# Patient Record
Sex: Female | Born: 1960 | Race: White | Hispanic: No | Marital: Married | Smoking: Never smoker
Health system: Southern US, Community
[De-identification: ages and names within clinical notes are randomized; demographics above are authoritative.]

## PROBLEM LIST (undated history)

## (undated) DIAGNOSIS — F329 Major depressive disorder, single episode, unspecified: Secondary | ICD-10-CM

## (undated) DIAGNOSIS — M1990 Unspecified osteoarthritis, unspecified site: Secondary | ICD-10-CM

## (undated) DIAGNOSIS — G709 Myoneural disorder, unspecified: Secondary | ICD-10-CM

## (undated) DIAGNOSIS — M810 Age-related osteoporosis without current pathological fracture: Secondary | ICD-10-CM

## (undated) DIAGNOSIS — F32A Depression, unspecified: Secondary | ICD-10-CM

## (undated) DIAGNOSIS — E162 Hypoglycemia, unspecified: Secondary | ICD-10-CM

## (undated) HISTORY — DX: Age-related osteoporosis without current pathological fracture: M81.0

---

## 1994-10-05 HISTORY — PX: GANGLION CYST EXCISION: SHX1691

## 2012-10-05 HISTORY — PX: ABDOMINAL HYSTERECTOMY: SHX81

## 2015-08-28 ENCOUNTER — Other Ambulatory Visit: Payer: Self-pay | Admitting: Surgery

## 2015-08-28 DIAGNOSIS — M5416 Radiculopathy, lumbar region: Secondary | ICD-10-CM

## 2015-09-16 ENCOUNTER — Emergency Department
Admission: EM | Admit: 2015-09-16 | Discharge: 2015-09-16 | Disposition: A | Discharge status: Home / Self Care | Payer: Self-pay | Attending: Emergency Medicine | Admitting: Emergency Medicine

## 2015-09-16 ENCOUNTER — Encounter: Payer: Self-pay | Admitting: Emergency Medicine

## 2015-09-16 DIAGNOSIS — M5442 Lumbago with sciatica, left side: Secondary | ICD-10-CM | POA: Insufficient documentation

## 2015-09-16 DIAGNOSIS — Z79899 Other long term (current) drug therapy: Secondary | ICD-10-CM | POA: Insufficient documentation

## 2015-09-16 HISTORY — DX: Depression, unspecified: F32.A

## 2015-09-16 HISTORY — DX: Major depressive disorder, single episode, unspecified: F32.9

## 2015-09-16 LAB — CBC WITH DIFFERENTIAL/PLATELET
BASOS ABS: 0 10*3/uL (ref 0–0.1)
BASOS PCT: 0 %
EOS ABS: 0 10*3/uL (ref 0–0.7)
Eosinophils Relative: 0 %
HEMATOCRIT: 40.5 % (ref 35.0–47.0)
Hemoglobin: 13.6 g/dL (ref 12.0–16.0)
Lymphocytes Relative: 11 %
Lymphs Abs: 1.3 10*3/uL (ref 1.0–3.6)
MCH: 29.2 pg (ref 26.0–34.0)
MCHC: 33.7 g/dL (ref 32.0–36.0)
MCV: 86.7 fL (ref 80.0–100.0)
MONO ABS: 0.7 10*3/uL (ref 0.2–0.9)
Monocytes Relative: 6 %
NEUTROS ABS: 9.3 10*3/uL — AB (ref 1.4–6.5)
Neutrophils Relative %: 83 %
PLATELETS: 203 10*3/uL (ref 150–440)
RBC: 4.67 MIL/uL (ref 3.80–5.20)
RDW: 12.8 % (ref 11.5–14.5)
WBC: 11.2 10*3/uL — ABNORMAL HIGH (ref 3.6–11.0)

## 2015-09-16 LAB — BASIC METABOLIC PANEL
ANION GAP: 7 (ref 5–15)
BUN: 13 mg/dL (ref 6–20)
CALCIUM: 9.1 mg/dL (ref 8.9–10.3)
CO2: 23 mmol/L (ref 22–32)
Chloride: 108 mmol/L (ref 101–111)
Creatinine, Ser: 0.64 mg/dL (ref 0.44–1.00)
Glucose, Bld: 94 mg/dL (ref 65–99)
Potassium: 3.7 mmol/L (ref 3.5–5.1)
SODIUM: 138 mmol/L (ref 135–145)

## 2015-09-16 MED ORDER — IBUPROFEN 800 MG PO TABS: 800.0000 mg | ORAL_TABLET | Freq: Three times a day (TID) | ORAL | Status: DC | PRN

## 2015-09-16 MED ORDER — DIAZEPAM 5 MG/ML IJ SOLN
1.0000 mg | Freq: Once | INTRAMUSCULAR | Status: AC
  Administered 2015-09-16: 1 mg via INTRAVENOUS
  Filled 2015-09-16: qty 2

## 2015-09-16 MED ORDER — OXYCODONE-ACETAMINOPHEN 5-325 MG PO TABS
1.0000 | ORAL_TABLET | Freq: Once | ORAL | Status: AC
  Administered 2015-09-16: 1 via ORAL
  Filled 2015-09-16: qty 1

## 2015-09-16 MED ORDER — KETOROLAC TROMETHAMINE 30 MG/ML IJ SOLN
30.0000 mg | Freq: Once | INTRAMUSCULAR | Status: AC
  Administered 2015-09-16: 30 mg via INTRAVENOUS
  Filled 2015-09-16: qty 1

## 2015-09-16 NOTE — ED Notes (Signed)
Patient up to wheelchair for DC, began to feel weak, returned to bed and juice given, resting.

## 2015-09-16 NOTE — ED Notes (Signed)
Feeling better, up to wheelchair, dc with husband by car

## 2015-09-16 NOTE — ED Notes (Signed)
54 yof presents from home via EMS for c/c exacerbation of low back pain. Recently dx in 07/2015 with compression of lumbar vertebrae [facility]. Scheduled for MRI this week. Today patient woke from sleep to use the bathroom, pain exacerbated with radiation down her leg. No incontinence of urine or stool.

## 2015-09-16 NOTE — Discharge Instructions (Signed)
Chronic Back Pain  When back pain lasts longer than 3 months, it is called chronic back pain.People with chronic back pain often go through certain periods that are more intense (flare-ups).  CAUSES Chronic back pain can be caused by wear and tear (degeneration) on different structures in your back. These structures include:  The bones of your spine (vertebrae) and the joints surrounding your spinal cord and nerve roots (facets).  The strong, fibrous tissues that connect your vertebrae (ligaments). Degeneration of these structures may result in pressure on your nerves. This can lead to constant pain. HOME CARE INSTRUCTIONS  Avoid bending, heavy lifting, prolonged sitting, and activities which make the problem worse.  Take brief periods of rest throughout the day to reduce your pain. Lying down or standing usually is better than sitting while you are resting.  Take over-the-counter or prescription medicines only as directed by your caregiver. SEEK IMMEDIATE MEDICAL CARE IF:   You have weakness or numbness in one of your legs or feet.  You have trouble controlling your bladder or bowels.  You have nausea, vomiting, abdominal pain, shortness of breath, or fainting.   This information is not intended to replace advice given to you by your health care provider. Make sure you discuss any questions you have with your health care provider.   Document Released: 10/29/2004 Document Revised: 12/14/2011 Document Reviewed: 03/11/2015 Elsevier Interactive Patient Education [employer]2016 [employer]vier Inc.  Please follow-up with your doctor and get the MRI as planned. Please return here if you need more pain control. For now I want to try some 800 mg Motrin 3 times a day with food. To any heavy lifting or strenuous activity.

## 2015-09-16 NOTE — ED Provider Notes (Signed)
[facility] Emergency Department Provider Note  ____________________________________________  Time seen: Approximately 5:21 AM  I have reviewed the triage vital signs and the nursing notes.   HISTORY  Chief Complaint Back Pain    HPI Annette Davis is a 54 y.o. female who presents to the ED from home via EMS with the chief complaint of low back pain.Patient is undergoing evaluation by [facility] orthopedics since October for "pinched nerves in back" which she believes was from moving boxes. Scheduled for MRI later this week. She is currently taking baclofen. This morning, patient awoke from sleep to use the restroom which exacerbated her pain. Complains of low back pain with spasms with sharp shooting pains radiating down her left leg. Denies associated symptoms of numbness/tingling, bowel or bladder incontinence. Denies recent trauma. Denies recent fever, chills, chest pain, shortness breath, abdominal pain, nausea, vomiting, diarrhea. Nothing makes her pain better. Movement makes her pain worse.   Past Medical History  Diagnosis Date  . Depression     There are no active problems to display for this patient.   Past Surgical History  Procedure Laterality Date  . Abdominal hysterectomy  2014    Current Outpatient Rx  Name  Route  Sig  Dispense  Refill  . baclofen (LIORESAL) 10 MG tablet   Oral   Take 10 mg by mouth 3 (three) times daily.         . citalopram (CELEXA) 20 MG tablet   Oral   Take 20 mg by mouth daily.           Allergies Sulfa antibiotics  History reviewed. No pertinent family history.  Social History Social History  Substance Use Topics  . Smoking status: Never Smoker   . Smokeless tobacco: None  . Alcohol Use: No    Review of Systems Constitutional: No fever/chills Eyes: No visual changes. ENT: No sore throat. Cardiovascular: Denies chest pain. Respiratory: Denies shortness of breath. Gastrointestinal: No  abdominal pain.  No nausea, no vomiting.  No diarrhea.  No constipation. Genitourinary: Negative for dysuria. Musculoskeletal: Positive for back pain. Skin: Negative for rash. Neurological: Negative for headaches, focal weakness or numbness.  10-point ROS otherwise negative.  ____________________________________________   PHYSICAL EXAM:  VITAL SIGNS: ED Triage Vitals  Enc Vitals Group     BP 09/16/15 0421 106/73 mmHg     Pulse Rate 09/16/15 0421 84     Resp 09/16/15 0421 18     Temp 09/16/15 0421 98.1 F (36.7 C)     Temp Source 09/16/15 0421 Oral     SpO2 09/16/15 0421 95 %     Weight 09/16/15 0421 135 lb (61.236 kg)     Height 09/16/15 0421  (1.651 m)     Head Cir --      Peak Flow --      Pain Score 09/16/15 0422 7     Pain Loc --      Pain Edu? --      Excl. in GC? --     Constitutional: Alert and oriented. Well appearing and in moderate acute distress. Eyes: Conjunctivae are normal. PERRL. EOMI. Head: Atraumatic. Nose: No congestion/rhinnorhea. Mouth/Throat: Mucous membranes are moist.  Oropharynx non-erythematous. Neck: No stridor.  No cervical spine tenderness to palpation. Cardiovascular: Normal rate, regular rhythm. Grossly normal heart sounds.  Good peripheral circulation. Respiratory: Normal respiratory effort.  No retractions. Lungs CTAB. Gastrointestinal: Soft and nontender. No distention. No abdominal bruits. No CVA tenderness. Musculoskeletal: No lumbar  spinal tenderness to palpation. Lumbar paraspinal spasms, left greater than right. Left buttock tender to palpation. Straight leg raise positive at 30 on the left. Neurologic:  Normal speech and language. No gross focal neurologic deficits are appreciated. 5/5 motor strength and sensation bilateral lower extremities. Dorsiflexion equal and strong bilaterally. Skin:  Skin is warm, dry and intact. No rash noted. Psychiatric: Mood and affect are normal. Speech and behavior are  normal.  ____________________________________________   LABS (all labs ordered are listed, but only abnormal results are displayed)  Labs Reviewed  CBC WITH DIFFERENTIAL/PLATELET  BASIC METABOLIC PANEL   ____________________________________________  EKG  None ____________________________________________  RADIOLOGY  None ____________________________________________   PROCEDURES  Procedure(s) performed: None  Critical Care performed: No  ____________________________________________   INITIAL IMPRESSION / ASSESSMENT AND PLAN / ED COURSE  Pertinent labs & imaging results that were available during my care of the patient were reviewed by me and considered in my medical decision making (see chart for details).  54 year old female with ongoing low back pain with exacerbation this morning. Symptoms consistent with left acute sciatica. Will administer IV analgesia, muscle relaxer and reassess.  ----------------------------------------- 7:13 AM on 09/16/2015 -----------------------------------------  Patient quite sedated after IV medicines. Laboratory results pending. Discussed with spouse; will continue to observe patient in the emergency department. Will reassess. Anticipate discharge home is patient is able to ambulate. Care transferred to  Dr. Darnelle CatalanMalinda. ____________________________________________   FINAL CLINICAL IMPRESSION(S) / ED DIAGNOSES  Final diagnoses:  Low back pain with left-sided sciatica, unspecified back pain laterality      Irean HongJade J Demorio Seeley, MD 09/16/15 [phone]

## 2015-09-18 ENCOUNTER — Ambulatory Visit
Admission: RE | Admit: 2015-09-18 | Discharge: 2015-09-18 | Disposition: A | Discharge status: Home / Self Care | Payer: Self-pay | Source: Ambulatory Visit | Attending: Surgery | Admitting: Surgery

## 2015-09-18 DIAGNOSIS — M47816 Spondylosis without myelopathy or radiculopathy, lumbar region: Secondary | ICD-10-CM | POA: Insufficient documentation

## 2015-09-18 DIAGNOSIS — M899 Disorder of bone, unspecified: Secondary | ICD-10-CM | POA: Insufficient documentation

## 2015-09-18 DIAGNOSIS — M5416 Radiculopathy, lumbar region: Secondary | ICD-10-CM | POA: Insufficient documentation

## 2015-09-20 ENCOUNTER — Other Ambulatory Visit: Payer: Self-pay | Admitting: Student

## 2015-09-20 DIAGNOSIS — M899 Disorder of bone, unspecified: Secondary | ICD-10-CM

## 2015-09-27 ENCOUNTER — Ambulatory Visit: Payer: Self-pay

## 2015-10-02 ENCOUNTER — Ambulatory Visit
Admission: RE | Admit: 2015-10-02 | Discharge: 2015-10-02 | Disposition: A | Discharge status: Home / Self Care | Payer: Self-pay | Source: Ambulatory Visit | Attending: Student | Admitting: Student

## 2015-10-02 DIAGNOSIS — M899 Disorder of bone, unspecified: Secondary | ICD-10-CM | POA: Insufficient documentation

## 2015-11-13 ENCOUNTER — Other Ambulatory Visit: Payer: Self-pay | Admitting: Internal Medicine

## 2015-11-13 DIAGNOSIS — Z1239 Encounter for other screening for malignant neoplasm of breast: Secondary | ICD-10-CM

## 2015-11-15 ENCOUNTER — Other Ambulatory Visit: Payer: Self-pay | Admitting: Orthopedic Surgery

## 2015-11-21 ENCOUNTER — Other Ambulatory Visit: Payer: Self-pay | Admitting: *Deleted

## 2015-11-21 ENCOUNTER — Other Ambulatory Visit: Payer: Self-pay | Admitting: Internal Medicine

## 2015-11-21 ENCOUNTER — Inpatient Hospital Stay
Admission: RE | Admit: 2015-11-21 | Discharge: 2015-11-21 | Disposition: A | Discharge status: Home / Self Care | Payer: Self-pay | Source: Ambulatory Visit | Attending: *Deleted | Admitting: *Deleted

## 2015-11-21 DIAGNOSIS — R921 Mammographic calcification found on diagnostic imaging of breast: Secondary | ICD-10-CM

## 2015-11-21 DIAGNOSIS — Z9289 Personal history of other medical treatment: Secondary | ICD-10-CM

## 2015-11-23 NOTE — H&P (Addendum)
     PREOPERATIVE H&P  Chief Complaint: R leg pain  HPI: Lasundra Hascall is a 55 y.o. 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 Depression    Past Surgical History  Procedure Laterality Date  . Abdominal hysterectomy  2014   Social History   Social History  . Marital Status: Married    Spouse Name: N/A  . Number of Children: N/A  . Years of Education: N/A   Social History Main Topics  . Smoking status: Never Smoker   . Smokeless tobacco: Not on file  . Alcohol Use: No  . Drug Use: No  . Sexual Activity: No   Other Topics Concern  . Not on file   Social History Narrative  . No narrative on file   No family history on file. Allergies  Allergen Reactions  . Other Swelling    Cannot swallow raw carrots  . Sulfa Antibiotics Nausea And Vomiting   Prior to Admission medications   Medication Sig Start Date End Date Taking? Authorizing Provider  baclofen (LIORESAL) 10 MG tablet Take 10 mg by mouth daily as needed for muscle spasms. Reported on 11/20/2015   Yes Historical Provider, MD  Calcium Carbonate-Vitamin D (CALCIUM-VITAMIN D) 500-200 MG-UNIT tablet Take 1 tablet by mouth 2 (two) times daily.   Yes Historical Provider, MD  citalopram (CELEXA) 20 MG tablet Take 20 mg by mouth daily.   Yes Historical Provider, MD  HYDROcodone-acetaminophen (NORCO/VICODIN) 5-325 MG tablet Take 1 tablet by mouth 2 (two) times daily as needed for severe pain.  09/26/15  Yes Historical Provider, MD  ibuprofen (ADVIL,MOTRIN) 800 MG tablet Take 1 tablet (800 mg total) by mouth every 8 (eight) hours as needed. Patient taking differently: Take 800 mg by mouth 2 (two) times daily  as needed for moderate pain.  09/16/15  Yes Arnaldo Natal, MD  Multiple Vitamin (MULTI-VITAMINS) TABS Take 1 tablet by mouth daily.   Yes Historical Provider, MD     All other systems have been reviewed and were otherwise negative with the exception of those mentioned in the HPI and as above.  Physical Exam: There were no vitals filed for this visit.  General: Alert, no acute distress Cardiovascular: No pedal edema Respiratory: No cyanosis, no use of accessory musculature Skin: No lesions in the area of chief complaint Neurologic: Sensation intact distally Psychiatric: Patient is competent for consent with normal mood and affect Lymphatic: No axillary or cervical lymphadenopathy  MUSCULOSKELETAL: 4/5 knee extension on the right  Assessment/Plan: Radiculopathy Plan for Procedure(s): RIGHT SIDED LUMBAR 4-5 TRANSFORAMINAL LUMBAR INTERBODY FUSION WITH INSTRUMENTATION AND ALLOGRAFT   Emilee Hero, MD 11/23/2015 8:37 AM   Of note, preoperatively this AM, patient actually reports that she hasn't had any pain in the R leg leg, but has been having significant constant pain in the L leg. Will therefore use a left-sided approach for TLIF. This has been discussed with patient, and she is in agreement.

## 2015-11-25 ENCOUNTER — Other Ambulatory Visit (HOSPITAL_COMMUNITY): Payer: Self-pay | Admitting: *Deleted

## 2015-11-25 ENCOUNTER — Encounter (HOSPITAL_COMMUNITY)
Admission: RE | Admit: 2015-11-25 | Discharge: 2015-11-25 | Disposition: A | Discharge status: Home / Self Care | Payer: Self-pay | Source: Ambulatory Visit | Attending: Orthopedic Surgery | Admitting: Orthopedic Surgery

## 2015-11-25 ENCOUNTER — Encounter (HOSPITAL_COMMUNITY): Payer: Self-pay

## 2015-11-25 DIAGNOSIS — M5410 Radiculopathy, site unspecified: Secondary | ICD-10-CM | POA: Insufficient documentation

## 2015-11-25 DIAGNOSIS — Z01812 Encounter for preprocedural laboratory examination: Secondary | ICD-10-CM | POA: Insufficient documentation

## 2015-11-25 DIAGNOSIS — E162 Hypoglycemia, unspecified: Secondary | ICD-10-CM | POA: Insufficient documentation

## 2015-11-25 DIAGNOSIS — Z01818 Encounter for other preprocedural examination: Secondary | ICD-10-CM | POA: Insufficient documentation

## 2015-11-25 DIAGNOSIS — Z0183 Encounter for blood typing: Secondary | ICD-10-CM | POA: Insufficient documentation

## 2015-11-25 DIAGNOSIS — F329 Major depressive disorder, single episode, unspecified: Secondary | ICD-10-CM | POA: Insufficient documentation

## 2015-11-25 DIAGNOSIS — Z79899 Other long term (current) drug therapy: Secondary | ICD-10-CM | POA: Insufficient documentation

## 2015-11-25 HISTORY — DX: Hypoglycemia, unspecified: E16.2

## 2015-11-25 HISTORY — DX: Myoneural disorder, unspecified: G70.9

## 2015-11-25 HISTORY — DX: Unspecified osteoarthritis, unspecified site: M19.90

## 2015-11-25 LAB — URINALYSIS, ROUTINE W REFLEX MICROSCOPIC
BILIRUBIN URINE: NEGATIVE
Glucose, UA: NEGATIVE mg/dL
Ketones, ur: NEGATIVE mg/dL
NITRITE: NEGATIVE
Protein, ur: NEGATIVE mg/dL
Specific Gravity, Urine: 1.022 (ref 1.005–1.030)
pH: 5.5 (ref 5.0–8.0)

## 2015-11-25 LAB — URINE MICROSCOPIC-ADD ON

## 2015-11-25 LAB — SURGICAL PCR SCREEN
MRSA, PCR: NEGATIVE
STAPHYLOCOCCUS AUREUS: POSITIVE — AB

## 2015-11-25 LAB — TYPE AND SCREEN
ABO/RH(D): B POS
ANTIBODY SCREEN: NEGATIVE

## 2015-11-25 LAB — APTT: APTT: 25 s (ref 24–37)

## 2015-11-25 LAB — ABO/RH: ABO/RH(D): B POS

## 2015-11-25 LAB — PROTIME-INR
INR: 1.04 (ref 0.00–1.49)
PROTHROMBIN TIME: 13.8 s (ref 11.6–15.2)

## 2015-11-25 NOTE — Progress Notes (Signed)
Pt. Brought lab values with her from PCP. Will ask for anesth. Review to see if they suffice for surgery even though they are 2 weeks +1 day from surgery

## 2015-11-25 NOTE — Progress Notes (Signed)
PCP- Gloriann Loan, no cardiac testing ever done.

## 2015-11-25 NOTE — Pre-Procedure Instructions (Signed)
Annette Davis  11/25/2015      [id number] [place], [facility] - [address] [place] [address] Phone: [phone] Fax: [phone]    Your procedure is scheduled on 11/28/2015.  Report to [facility] Admitting at 5:30 A.M.  Call this number if you have problems the morning of surgery:  [phone]   Remember:  Do not eat food or drink liquids after midnight. On Wednesday   Take these medicines the morning of surgery with A SIP OF WATER : pain medicine is ok if needed the morning of surgery    Do not wear jewelry, make-up or nail polish.   Do not wear lotions, powders, or perfumes.  You may wear deodorant.   Do not shave 48 hours prior to surgery.     Do not bring valuables to the hospital.   [facility] is not responsible for any belongings or valuables.  Contacts, dentures or bridgework may not be worn into surgery.  Leave your suitcase in the car.  After surgery it may be brought to your room.  For patients admitted to the hospital, discharge time will be determined by your treatment team.  Patients discharged the day of surgery will not be allowed to drive home.   Name and phone number of your driver:   With spouse Special instructions:  Special Instructions: [place] - Preparing for Surgery  Before surgery, you can play an important role.  Because skin is not sterile, your skin needs to be as free of germs as possible.  You can reduce the number of germs on you skin by washing with CHG (chlorahexidine gluconate) soap before surgery.  CHG is an antiseptic cleaner which kills germs and bonds with the skin to continue killing germs even after washing.  Please DO NOT use if you have an allergy to CHG or antibacterial soaps.  If your skin becomes reddened/irritated stop using the CHG and inform your nurse when you arrive at Short Stay.  Do not shave (including legs and underarms) for at least 48 hours prior to the first CHG  shower.  You may shave your face.  Please follow these instructions carefully:   1.  Shower with CHG Soap the night before surgery and the  morning of Surgery.  2.  If you choose to wash your hair, wash your hair first as usual with your  normal shampoo.  3.  After you shampoo, rinse your hair and body thoroughly to remove the  Shampoo.  4.  Use CHG as you would any other liquid soap.  You can apply chg directly to the skin and wash gently with scrungie or a clean washcloth.  5.  Apply the CHG Soap to your body ONLY FROM THE NECK DOWN.    Do not use on open wounds or open sores.  Avoid contact with your eyes, ears, mouth and genitals (private parts).  Wash genitals (private parts)   with your normal soap.  6.  Wash thoroughly, paying special attention to the area where your surgery will be performed.  7.  Thoroughly rinse your body with warm water from the neck down.  8.  DO NOT shower/wash with your normal soap after using and rinsing off   the CHG Soap.  9.  Pat yourself dry with a clean towel.            10.  Wear clean pajamas.  11.  Place clean sheets on your bed the night of your first shower and do not sleep with pets.  Day of Surgery  Do not apply any lotions/deodorants the morning of surgery.  Please wear clean clothes to the hospital/surgery center.  Please read over the following fact sheets that you were given. Pain Booklet, Coughing and Deep Breathing, Blood Transfusion Information, MRSA Information and Surgical Site Infection Prevention

## 2015-11-26 NOTE — Progress Notes (Signed)
Anesthesia Chart Review: Patient is a 55 year old female scheduled for right sided L4-5 transforaminal lumbar fusion on 11/28/15 by Dr. Yevette Edwards.  History includes non-smoker, arthritis, hypoglycemia, depression, hysterectomy '14. By PCP notes, patient also had spinal surgery 07/2014 [facility] - [place], [place]). She recently relocated to [place].  BMI 23. PCP is Dr. Leotis Shames with [facility], seen 11/13/15 for annual exam and to establish care (see Care Everywhere). She is aware of surgery plans.  PAT Vitals: BP 106/66, HR 82, RR 20, T 36.4C, O2 sat 96%.  Meds include baclofen, Celexa, ibuprofen, MVI, Norco.   11/25/15 EKG: NSR. Non-specific T wave abnormality. Isolated small Q wave in a low voltage QRS complex in aVL.  11/25/15 CXR: IMPRESSION: There is no active cardiopulmonary disease.  Labs on 11/13/15 (see Care Everywhere, copy also on chart) showed a normal CBC, normal Chem profile except glucose was 63. Total cholesterol 201 (HDL 65, LDL 115). Normal TSH 1.735. PT/PTT and T&S were done 11/25/15.  Her 11/13/15 labs were unremarkable and will only be 32 days old on her surgery date. She in not a diuretic or anti-hypertensive medication, so I am not planning to repeat any labs prior to her surgery. Fortunately, with her history of hypoglycemia, she is a first case. Will order a CBG for baseline prior to surgery.   If no acute changes then I anticipate that she can proceed as planned.  Velna Ochs [facility] Short Stay Center/Anesthesiology Phone [phone] 11/26/2015 12:25 PM

## 2015-11-27 MED ORDER — CEFAZOLIN SODIUM-DEXTROSE 2-3 GM-% IV SOLR
2.0000 g | INTRAVENOUS | Status: AC
  Administered 2015-11-28: 2 g via INTRAVENOUS
  Filled 2015-11-27: qty 50

## 2015-11-27 MED ORDER — POVIDONE-IODINE 7.5 % EX SOLN
Freq: Once | CUTANEOUS | Status: DC
  Filled 2015-11-27: qty 118

## 2015-11-28 ENCOUNTER — Inpatient Hospital Stay (HOSPITAL_COMMUNITY): Payer: Self-pay | Admitting: Vascular Surgery

## 2015-11-28 ENCOUNTER — Inpatient Hospital Stay (HOSPITAL_COMMUNITY): Payer: Self-pay

## 2015-11-28 ENCOUNTER — Encounter (HOSPITAL_COMMUNITY)
Admission: RE | Disposition: A | Discharge status: Home / Self Care | Payer: Self-pay | Source: Ambulatory Visit | Attending: Orthopedic Surgery

## 2015-11-28 ENCOUNTER — Inpatient Hospital Stay (HOSPITAL_COMMUNITY): Payer: Self-pay | Admitting: Anesthesiology

## 2015-11-28 ENCOUNTER — Encounter (HOSPITAL_COMMUNITY): Payer: Self-pay | Admitting: Surgery

## 2015-11-28 ENCOUNTER — Inpatient Hospital Stay (HOSPITAL_COMMUNITY)
Admission: RE | Admit: 2015-11-28 | Discharge: 2015-11-30 | DRG: 460 | Disposition: A | Discharge status: Home / Self Care | Payer: Self-pay | Source: Ambulatory Visit | Attending: Orthopedic Surgery | Admitting: Orthopedic Surgery

## 2015-11-28 DIAGNOSIS — M5116 Intervertebral disc disorders with radiculopathy, lumbar region: Principal | ICD-10-CM | POA: Diagnosis present

## 2015-11-28 DIAGNOSIS — Z79899 Other long term (current) drug therapy: Secondary | ICD-10-CM

## 2015-11-28 DIAGNOSIS — M5410 Radiculopathy, site unspecified: Secondary | ICD-10-CM | POA: Diagnosis present

## 2015-11-28 DIAGNOSIS — M4316 Spondylolisthesis, lumbar region: Secondary | ICD-10-CM | POA: Diagnosis present

## 2015-11-28 DIAGNOSIS — Z419 Encounter for procedure for purposes other than remedying health state, unspecified: Secondary | ICD-10-CM

## 2015-11-28 DIAGNOSIS — F329 Major depressive disorder, single episode, unspecified: Secondary | ICD-10-CM | POA: Diagnosis present

## 2015-11-28 DIAGNOSIS — M4806 Spinal stenosis, lumbar region: Secondary | ICD-10-CM | POA: Diagnosis present

## 2015-11-28 LAB — GLUCOSE, CAPILLARY: GLUCOSE-CAPILLARY: 84 mg/dL (ref 65–99)

## 2015-11-28 SURGERY — POSTERIOR LUMBAR FUSION 1 LEVEL
Anesthesia: General | Laterality: Right

## 2015-11-28 MED ORDER — BUPIVACAINE-EPINEPHRINE (PF) 0.25% -1:200000 IJ SOLN
INTRAMUSCULAR | Status: AC
  Filled 2015-11-28: qty 30

## 2015-11-28 MED ORDER — DIPHENHYDRAMINE HCL 50 MG/ML IJ SOLN
INTRAMUSCULAR | Status: DC | PRN
  Administered 2015-11-28: 12.5 mg via INTRAVENOUS

## 2015-11-28 MED ORDER — ROCURONIUM BROMIDE 100 MG/10ML IV SOLN
INTRAVENOUS | Status: DC | PRN
  Administered 2015-11-28: 20 mg via INTRAVENOUS
  Administered 2015-11-28: 10 mg via INTRAVENOUS
  Administered 2015-11-28: 40 mg via INTRAVENOUS

## 2015-11-28 MED ORDER — MENTHOL 3 MG MT LOZG: 1.0000 | LOZENGE | OROMUCOSAL | Status: DC | PRN

## 2015-11-28 MED ORDER — OXYCODONE-ACETAMINOPHEN 5-325 MG PO TABS
1.0000 | ORAL_TABLET | ORAL | Status: DC | PRN
  Administered 2015-11-28 – 2015-11-30 (×9): 2 via ORAL
  Filled 2015-11-28 (×8): qty 2

## 2015-11-28 MED ORDER — PHENYLEPHRINE HCL 10 MG/ML IJ SOLN
INTRAMUSCULAR | Status: DC | PRN
  Administered 2015-11-28: 80 ug via INTRAVENOUS

## 2015-11-28 MED ORDER — DOCUSATE SODIUM 100 MG PO CAPS
100.0000 mg | ORAL_CAPSULE | Freq: Two times a day (BID) | ORAL | Status: DC
  Administered 2015-11-28 – 2015-11-29 (×3): 100 mg via ORAL
  Filled 2015-11-28 (×3): qty 1

## 2015-11-28 MED ORDER — MIDAZOLAM HCL 5 MG/5ML IJ SOLN
INTRAMUSCULAR | Status: DC | PRN
  Administered 2015-11-28: 2 mg via INTRAVENOUS

## 2015-11-28 MED ORDER — SENNOSIDES-DOCUSATE SODIUM 8.6-50 MG PO TABS: 1.0000 | ORAL_TABLET | Freq: Every evening | ORAL | Status: DC | PRN

## 2015-11-28 MED ORDER — DIAZEPAM 5 MG PO TABS
5.0000 mg | ORAL_TABLET | Freq: Four times a day (QID) | ORAL | Status: DC | PRN
  Administered 2015-11-28 – 2015-11-30 (×5): 5 mg via ORAL
  Filled 2015-11-28 (×5): qty 1

## 2015-11-28 MED ORDER — SODIUM CHLORIDE 0.9 % IV SOLN: 250.0000 mL | INTRAVENOUS | Status: DC

## 2015-11-28 MED ORDER — LIDOCAINE HCL (CARDIAC) 20 MG/ML IV SOLN
INTRAVENOUS | Status: AC
  Filled 2015-11-28: qty 5

## 2015-11-28 MED ORDER — ONDANSETRON HCL 4 MG/2ML IJ SOLN
INTRAMUSCULAR | Status: DC | PRN
  Administered 2015-11-28: 4 mg via INTRAVENOUS

## 2015-11-28 MED ORDER — ACETAMINOPHEN 650 MG RE SUPP: 650.0000 mg | RECTAL | Status: DC | PRN

## 2015-11-28 MED ORDER — HYDROMORPHONE HCL 1 MG/ML IJ SOLN
INTRAMUSCULAR | Status: AC
  Filled 2015-11-28: qty 1

## 2015-11-28 MED ORDER — PROPOFOL 10 MG/ML IV BOLUS
INTRAVENOUS | Status: AC
  Filled 2015-11-28: qty 20

## 2015-11-28 MED ORDER — HYDROMORPHONE HCL 1 MG/ML IJ SOLN
0.2500 mg | INTRAMUSCULAR | Status: DC | PRN
  Administered 2015-11-28: 1 mg via INTRAVENOUS
  Administered 2015-11-28: 0.5 mg via INTRAVENOUS

## 2015-11-28 MED ORDER — LACTATED RINGERS IV SOLN
INTRAVENOUS | Status: DC | PRN
  Administered 2015-11-28 (×3): via INTRAVENOUS

## 2015-11-28 MED ORDER — PROPOFOL 10 MG/ML IV BOLUS
INTRAVENOUS | Status: DC | PRN
  Administered 2015-11-28: 110 mg via INTRAVENOUS

## 2015-11-28 MED ORDER — 0.9 % SODIUM CHLORIDE (POUR BTL) OPTIME
TOPICAL | Status: DC | PRN
  Administered 2015-11-28: 1000 mL

## 2015-11-28 MED ORDER — OXYCODONE-ACETAMINOPHEN 5-325 MG PO TABS
ORAL_TABLET | ORAL | Status: AC
  Filled 2015-11-28: qty 2

## 2015-11-28 MED ORDER — PHENYLEPHRINE 40 MCG/ML (10ML) SYRINGE FOR IV PUSH (FOR BLOOD PRESSURE SUPPORT)
PREFILLED_SYRINGE | INTRAVENOUS | Status: AC
  Filled 2015-11-28: qty 10

## 2015-11-28 MED ORDER — ALUM & MAG HYDROXIDE-SIMETH 200-200-20 MG/5ML PO SUSP: 30.0000 mL | Freq: Four times a day (QID) | ORAL | Status: DC | PRN

## 2015-11-28 MED ORDER — HYDROCODONE-ACETAMINOPHEN 5-325 MG PO TABS: 1.0000 | ORAL_TABLET | ORAL | Status: DC | PRN

## 2015-11-28 MED ORDER — ROCURONIUM BROMIDE 50 MG/5ML IV SOLN
INTRAVENOUS | Status: AC
  Filled 2015-11-28: qty 1

## 2015-11-28 MED ORDER — FENTANYL CITRATE (PF) 250 MCG/5ML IJ SOLN
INTRAMUSCULAR | Status: AC
  Filled 2015-11-28: qty 5

## 2015-11-28 MED ORDER — PHENOL 1.4 % MT LIQD: 1.0000 | OROMUCOSAL | Status: DC | PRN

## 2015-11-28 MED ORDER — FENTANYL CITRATE (PF) 100 MCG/2ML IJ SOLN
INTRAMUSCULAR | Status: DC | PRN
  Administered 2015-11-28 (×3): 50 ug via INTRAVENOUS

## 2015-11-28 MED ORDER — SODIUM CHLORIDE 0.9 % IV SOLN: INTRAVENOUS | Status: DC

## 2015-11-28 MED ORDER — MULTI-VITAMINS PO TABS: 1.0000 | ORAL_TABLET | Freq: Every day | ORAL | Status: DC

## 2015-11-28 MED ORDER — DEXTROSE 5 % IV SOLN
10.0000 mg | INTRAVENOUS | Status: DC | PRN
  Administered 2015-11-28: 50 ug/min via INTRAVENOUS

## 2015-11-28 MED ORDER — THROMBIN 20000 UNITS EX SOLR
CUTANEOUS | Status: AC
  Filled 2015-11-28: qty 20000

## 2015-11-28 MED ORDER — METHYLENE BLUE 0.5 % INJ SOLN
64.0000 mg | INTRAVENOUS | Status: DC | PRN
  Administered 2015-11-28: .01 mg via INTRAVENOUS

## 2015-11-28 MED ORDER — BISACODYL 5 MG PO TBEC: 5.0000 mg | DELAYED_RELEASE_TABLET | Freq: Every day | ORAL | Status: DC | PRN

## 2015-11-28 MED ORDER — MIDAZOLAM HCL 2 MG/2ML IJ SOLN
INTRAMUSCULAR | Status: AC
  Filled 2015-11-28: qty 2

## 2015-11-28 MED ORDER — CITALOPRAM HYDROBROMIDE 20 MG PO TABS
20.0000 mg | ORAL_TABLET | Freq: Every day | ORAL | Status: DC
  Administered 2015-11-28 – 2015-11-29 (×2): 20 mg via ORAL
  Filled 2015-11-28 (×2): qty 1

## 2015-11-28 MED ORDER — FLEET ENEMA 7-19 GM/118ML RE ENEM: 1.0000 | ENEMA | Freq: Once | RECTAL | Status: DC | PRN

## 2015-11-28 MED ORDER — ACETAMINOPHEN 325 MG PO TABS: 650.0000 mg | ORAL_TABLET | ORAL | Status: DC | PRN

## 2015-11-28 MED ORDER — BUPIVACAINE-EPINEPHRINE 0.25% -1:200000 IJ SOLN
INTRAMUSCULAR | Status: DC | PRN
  Administered 2015-11-28: 24 mL

## 2015-11-28 MED ORDER — LIDOCAINE HCL (CARDIAC) 20 MG/ML IV SOLN
INTRAVENOUS | Status: DC | PRN
  Administered 2015-11-28: 60 mg via INTRAVENOUS

## 2015-11-28 MED ORDER — THROMBIN 20000 UNITS EX SOLR
CUTANEOUS | Status: DC | PRN
  Administered 2015-11-28: 09:00:00 via TOPICAL

## 2015-11-28 MED ORDER — ZOLPIDEM TARTRATE 5 MG PO TABS: 5.0000 mg | ORAL_TABLET | Freq: Every evening | ORAL | Status: DC | PRN

## 2015-11-28 MED ORDER — CALCIUM-VITAMIN D 500-200 MG-UNIT PO TABS: 1.0000 | ORAL_TABLET | Freq: Two times a day (BID) | ORAL | Status: DC

## 2015-11-28 MED ORDER — ONDANSETRON HCL 4 MG/2ML IJ SOLN: 4.0000 mg | INTRAMUSCULAR | Status: DC | PRN

## 2015-11-28 MED ORDER — MORPHINE SULFATE (PF) 2 MG/ML IV SOLN
1.0000 mg | INTRAVENOUS | Status: DC | PRN
  Administered 2015-11-28: 4 mg via INTRAVENOUS
  Administered 2015-11-28: 2 mg via INTRAVENOUS
  Administered 2015-11-28 – 2015-11-30 (×3): 4 mg via INTRAVENOUS
  Filled 2015-11-28 (×5): qty 2

## 2015-11-28 MED ORDER — CEFAZOLIN SODIUM 1-5 GM-% IV SOLN
1.0000 g | Freq: Three times a day (TID) | INTRAVENOUS | Status: AC
  Administered 2015-11-28 (×2): 1 g via INTRAVENOUS
  Filled 2015-11-28 (×2): qty 50

## 2015-11-28 MED ORDER — SODIUM CHLORIDE 0.9% FLUSH
3.0000 mL | Freq: Two times a day (BID) | INTRAVENOUS | Status: DC
  Administered 2015-11-28 – 2015-11-29 (×3): 3 mL via INTRAVENOUS

## 2015-11-28 MED ORDER — METHOCARBAMOL 500 MG PO TABS
ORAL_TABLET | ORAL | Status: AC
  Administered 2015-11-28: 500 mg
  Filled 2015-11-28: qty 1

## 2015-11-28 MED ORDER — SODIUM CHLORIDE 0.9% FLUSH: 3.0000 mL | INTRAVENOUS | Status: DC | PRN

## 2015-11-28 MED FILL — Heparin Sodium (Porcine) Inj 1000 Unit/ML: INTRAMUSCULAR | Qty: 30 | Status: AC

## 2015-11-28 MED FILL — Sodium Chloride IV Soln 0.9%: INTRAVENOUS | Qty: 1000 | Status: AC

## 2015-11-28 SURGICAL SUPPLY — 84 items
BENZOIN TINCTURE PRP APPL 2/3 (GAUZE/BANDAGES/DRESSINGS) ×2 IMPLANT
BLADE SURG ROTATE 9660 (MISCELLANEOUS) IMPLANT
BUR PRESCISION 1.7 ELITE (BURR) ×2 IMPLANT
BUR ROUND PRECISION 4.0 (BURR) ×4 IMPLANT
BUR SABER RD CUTTING 3.0 (BURR) IMPLANT
CAGE CONCORDE BULLET 11X12X27 (Cage) ×1 IMPLANT
CAGE SPNL PRLL BLT NOSE 27X11 (Cage) ×1 IMPLANT
CARTRIDGE OIL MAESTRO DRILL (MISCELLANEOUS) ×1 IMPLANT
CONT SPEC STER OR (MISCELLANEOUS) ×2 IMPLANT
COVER MAYO STAND STRL (DRAPES) ×4 IMPLANT
COVER SURGICAL LIGHT HANDLE (MISCELLANEOUS) ×2 IMPLANT
DIFFUSER DRILL AIR PNEUMATIC (MISCELLANEOUS) ×2 IMPLANT
DRAIN CHANNEL 15F RND FF W/TCR (WOUND CARE) IMPLANT
DRAPE C-ARM 42X72 X-RAY (DRAPES) ×2 IMPLANT
DRAPE C-ARMOR (DRAPES) IMPLANT
DRAPE POUCH INSTRU U-SHP 10X18 (DRAPES) ×2 IMPLANT
DRAPE SURG 17X23 STRL (DRAPES) ×8 IMPLANT
DURAPREP 26ML APPLICATOR (WOUND CARE) ×2 IMPLANT
ELECT BLADE 4.0 EZ CLEAN MEGAD (MISCELLANEOUS) ×2
ELECT CAUTERY BLADE 6.4 (BLADE) ×2 IMPLANT
ELECT REM PT RETURN 9FT ADLT (ELECTROSURGICAL) ×2
ELECTRODE BLDE 4.0 EZ CLN MEGD (MISCELLANEOUS) ×1 IMPLANT
ELECTRODE REM PT RTRN 9FT ADLT (ELECTROSURGICAL) ×1 IMPLANT
EVACUATOR SILICONE 100CC (DRAIN) IMPLANT
FEE INTRAOP MONITOR IMPULS NCS (MISCELLANEOUS) ×1 IMPLANT
GAUZE SPONGE 4X4 12PLY STRL (GAUZE/BANDAGES/DRESSINGS) ×2 IMPLANT
GAUZE SPONGE 4X4 16PLY XRAY LF (GAUZE/BANDAGES/DRESSINGS) ×8 IMPLANT
GLOVE BIO SURGEON STRL SZ7 (GLOVE) ×2 IMPLANT
GLOVE BIO SURGEON STRL SZ8 (GLOVE) ×2 IMPLANT
GLOVE BIOGEL PI IND STRL 7.0 (GLOVE) ×2 IMPLANT
GLOVE BIOGEL PI IND STRL 8 (GLOVE) ×1 IMPLANT
GLOVE BIOGEL PI INDICATOR 7.0 (GLOVE) ×2
GLOVE BIOGEL PI INDICATOR 8 (GLOVE) ×1
GLOVE SURG SS PI 7.0 STRL IVOR (GLOVE) ×2 IMPLANT
GOWN STRL REUS W/ TWL LRG LVL3 (GOWN DISPOSABLE) ×2 IMPLANT
GOWN STRL REUS W/ TWL XL LVL3 (GOWN DISPOSABLE) ×1 IMPLANT
GOWN STRL REUS W/TWL LRG LVL3 (GOWN DISPOSABLE) ×2
GOWN STRL REUS W/TWL XL LVL3 (GOWN DISPOSABLE) ×1
INTRAOP MONITOR FEE IMPULS NCS (MISCELLANEOUS) ×1
INTRAOP MONITOR FEE IMPULSE (MISCELLANEOUS) ×1
IV CATH 14GX2 1/4 (CATHETERS) ×2 IMPLANT
KIT BASIN OR (CUSTOM PROCEDURE TRAY) ×2 IMPLANT
KIT POSITION SURG JACKSON T1 (MISCELLANEOUS) ×2 IMPLANT
KIT ROOM TURNOVER OR (KITS) ×2 IMPLANT
MARKER SKIN DUAL TIP RULER LAB (MISCELLANEOUS) ×2 IMPLANT
MIX DBX 10CC 35% BONE (Bone Implant) ×2 IMPLANT
NDL SAFETY ECLIPSE 18X1.5 (NEEDLE) ×1 IMPLANT
NEEDLE 22X1 1/2 (OR ONLY) (NEEDLE) ×2 IMPLANT
NEEDLE HYPO 18GX1.5 SHARP (NEEDLE) ×1
NEEDLE HYPO 25GX1X1/2 BEV (NEEDLE) ×2 IMPLANT
NEEDLE SPNL 18GX3.5 QUINCKE PK (NEEDLE) ×4 IMPLANT
NS IRRIG 1000ML POUR BTL (IV SOLUTION) ×2 IMPLANT
OIL CARTRIDGE MAESTRO DRILL (MISCELLANEOUS) ×2
PACK LAMINECTOMY ORTHO (CUSTOM PROCEDURE TRAY) ×2 IMPLANT
PACK UNIVERSAL I (CUSTOM PROCEDURE TRAY) ×2 IMPLANT
PAD ARMBOARD 7.5X6 YLW CONV (MISCELLANEOUS) ×4 IMPLANT
PATTIES SURGICAL .5 X1 (DISPOSABLE) ×2 IMPLANT
PATTIES SURGICAL .5X1.5 (GAUZE/BANDAGES/DRESSINGS) ×2 IMPLANT
PROBE PEDCLE PROBE MAGSTM DISP (MISCELLANEOUS) ×2 IMPLANT
ROD PRE BENT EXP 40MM (Rod) ×2 IMPLANT
ROD PRE LORDOSED 5.5X45 (Rod) ×2 IMPLANT
SCREW SET SINGLE INNER (Screw) ×8 IMPLANT
SCREW VIPER CORT FIX 6X35 (Screw) ×4 IMPLANT
SCREW VIPER CORTICAL FIX 6X40 (Screw) ×4 IMPLANT
SPONGE INTESTINAL PEANUT (DISPOSABLE) ×2 IMPLANT
SPONGE SURGIFOAM ABS GEL 100 (HEMOSTASIS) ×2 IMPLANT
STRIP CLOSURE SKIN 1/2X4 (GAUZE/BANDAGES/DRESSINGS) ×2 IMPLANT
SURGIFLO W/THROMBIN 8M KIT (HEMOSTASIS) IMPLANT
SUT MNCRL AB 4-0 PS2 18 (SUTURE) ×4 IMPLANT
SUT VIC AB 0 CT1 18XCR BRD 8 (SUTURE) ×1 IMPLANT
SUT VIC AB 0 CT1 8-18 (SUTURE) ×1
SUT VIC AB 1 CT1 18XCR BRD 8 (SUTURE) ×2 IMPLANT
SUT VIC AB 1 CT1 8-18 (SUTURE) ×2
SUT VIC AB 2-0 CT2 18 VCP726D (SUTURE) ×2 IMPLANT
SYR 20CC LL (SYRINGE) ×2 IMPLANT
SYR BULB IRRIGATION 50ML (SYRINGE) ×2 IMPLANT
SYR CONTROL 10ML LL (SYRINGE) ×4 IMPLANT
SYR TB 1ML LUER SLIP (SYRINGE) ×2 IMPLANT
TAPE CLOTH SURG 4X10 WHT LF (GAUZE/BANDAGES/DRESSINGS) ×2 IMPLANT
TOWEL OR 17X24 6PK STRL BLUE (TOWEL DISPOSABLE) ×2 IMPLANT
TOWEL OR 17X26 10 PK STRL BLUE (TOWEL DISPOSABLE) ×2 IMPLANT
TRAY FOLEY CATH 16FRSI W/METER (SET/KITS/TRAYS/PACK) ×2 IMPLANT
WATER STERILE IRR 1000ML POUR (IV SOLUTION) ×2 IMPLANT
YANKAUER SUCT BULB TIP NO VENT (SUCTIONS) ×2 IMPLANT

## 2015-11-28 NOTE — Anesthesia Procedure Notes (Signed)
Procedure Name: Intubation Date/Time: 11/28/2015 7:37 AM Performed by: Rudi Rummage Pre-anesthesia Checklist: Patient identified, Emergency Drugs available, Suction available, Patient being monitored and Timeout performed Patient Re-evaluated:Patient Re-evaluated prior to inductionOxygen Delivery Method: Circle system utilized Preoxygenation: Pre-oxygenation with 100% oxygen Intubation Type: IV induction Ventilation: Mask ventilation without difficulty Laryngoscope Size: Mac and 3 Grade View: Grade II Tube type: Oral Tube size: 7.0 mm Number of attempts: 1 Placement Confirmation: ETT inserted through vocal cords under direct vision,  positive ETCO2 and breath sounds checked- equal and bilateral Secured at: 21 cm Tube secured with: Tape Dental Injury: Teeth and Oropharynx as per pre-operative assessment

## 2015-11-28 NOTE — Anesthesia Preprocedure Evaluation (Addendum)
Anesthesia Evaluation  Patient identified by MRN, date of birth, ID band Patient awake    Reviewed: Allergy & Precautions, H&P , NPO status , Patient's Chart, lab work & pertinent test results  Airway Mallampati: II  TM Distance: >3 FB Neck ROM: Full    Dental no notable dental hx. (+) Teeth Intact, Dental Advisory Given   Pulmonary neg pulmonary ROS,    Pulmonary exam normal breath sounds clear to auscultation       Cardiovascular negative cardio ROS   Rhythm:Regular Rate:Normal     Neuro/Psych Depression negative neurological ROS     GI/Hepatic negative GI ROS, Neg liver ROS,   Endo/Other  negative endocrine ROS  Renal/GU negative Renal ROS  negative genitourinary   Musculoskeletal  (+) Arthritis , Osteoarthritis,    Abdominal   Peds  Hematology negative hematology ROS (+)   Anesthesia Other Findings   Reproductive/Obstetrics negative OB ROS                            Anesthesia Physical Anesthesia Plan  ASA: II  Anesthesia Plan: General   Post-op Pain Management:    Induction: Intravenous  Airway Management Planned: Oral ETT  Additional Equipment:   Intra-op Plan:   Post-operative Plan: Extubation in OR  Informed Consent: I have reviewed the patients History and Physical, chart, labs and discussed the procedure including the risks, benefits and alternatives for the proposed anesthesia with the patient or authorized representative who has indicated his/her understanding and acceptance.   Dental advisory given  Plan Discussed with: CRNA  Anesthesia Plan Comments:         Anesthesia Quick Evaluation

## 2015-11-28 NOTE — Op Note (Signed)
Annette, Davis               ACCOUNT NO.:  [id number]  MEDICAL RECORD NO.:  [id number]  LOCATION:  3C08C                        FACILITY:  [facility]  PHYSICIAN:  Estill Bamberg, MD      DATE OF BIRTH:  [birth year]  DATE OF PROCEDURE:  11/28/2015                              OPERATIVE REPORT   PREOPERATIVE DIAGNOSES: 1. Bilateral L4 radiculopathy. 2. Slight grade 1 L4-5 spondylolisthesis.  POSTOPERATIVE DIAGNOSES: 1. Bilateral L4 radiculopathy. 2. Slight grade 1 L4-5 spondylolisthesis.  PROCEDURE: 1. Left-sided L4-5 transpedicular decompression with removal of large     herniated L4-5 disk herniation compressing the left L4 nerve. 2. Left-sided L4-5 transforaminal lumbar interbody fusion. 3. Right-sided L4-5 posterolateral fusion. 4. Placement of posterior instrumentation, L4-5. 5. Use of local autograft. 6. Use of morselized allograft. 7. Insertion of interbody device x1 (12 x 27 mm Concorde bullet cage). 8. Intraoperative use of fluoroscopy.  SURGEON:  Estill Bamberg, MD  ASSISTANT:  Jason Coop, PA-C  ANESTHESIA:  General endotracheal anesthesia.  COMPLICATIONS:  None.  DISPOSITION:  Stable.  ESTIMATED BLOOD LOSS:  Minimal.  INDICATIONS FOR SURGERY:  Briefly, Annette Davis is a very pleasant 55- year-old female who did present to me with ongoing and rather debilitating pain intermittently in her bilateral legs.  The pain did shift between the right and left legs depending on her activity.  The patient's MRI was notable for neural foraminal stenosis bilaterally at L4-5 and radiographs were notable for grade 1 L4-5 spondylolisthesis, which was noted to be very slight.  Of note, on the morning of surgery, the patient did feel that her left leg pain was severe, debilitating, and constant.  She did state that she did not have any pain in the right leg the morning of surgery, but the left leg pain was severe.  We therefore did discuss proceeding with the procedure noted  above, specifically, decompression and left-sided transforaminal fusion as reflected above.  The patient was fully aware of the risks and limitations of the procedure and did elect to proceed.  OPERATIVE DETAILS:  On November 28, 2015, patient was brought to surgery and general endotracheal anesthesia was administered.  The patient was placed prone on a well-padded flat Jackson bed with a Wilson frame. Antibiotics were given and a time-out procedure was performed.  The back was prepped and draped in the usual sterile fashion.  I then made a small midline incision overlying the L4-5 intervertebral space.  The fascia was incised at the midline.  The paraspinal musculature was bluntly swept laterally on the right and left sides.  A self-retaining retractor was placed.  Using anatomic landmarks in addition to AP and lateral fluoroscopy, I did cannulate the L4 and L5 pedicles bilaterally. On the right side, I did decorticate the posterior elements and posterolateral gutter.  On the right, I did place 6 mm screws of the appropriate length into the L4 and L5 pedicles.  A 45 mm rod was placed and distraction was applied across the rod.  On the left side, bone wax was placed in the cannulated pedicles.  I then proceeded with a transpedicular decompression on the left side at L4-5.  I did perform a full  facetectomy that I was able to visualize the exiting left L4 nerve. I then placed downward pressure just ventral to the nerve, and there were multiple herniated and extruded disk fragments located in the L4 vertebral body, clearly compressing the left L4 nerve.  These disk fragments were removed uneventfully, thereby decompressing the exiting L4 nerve.  I was very pleased with the decompression.  I then proceeded with a fusion portion of the procedure.  With an assistant holding medial retraction of the traversing left L5 nerve, I did perform an annulotomy at the posterolateral aspect of the L4-5  intervertebral space.  A thorough diskectomy was performed.  The endplates were prepared.  The intervertebral space was then packed with autograft from the decompression in addition to allograft in the form of DBX mix.  The appropriate size interbody spacer was also packed with allograft and autograft and tamped into position.  I was very pleased with the press- fit of the implant.  I then removed the distraction on the contralateral side.  I then placed 6 mm screws of the appropriate length at L4 and at L5 on the left side.  A 40 mm rod was placed on the left.  Caps were placed and provisionally tightened on the right and left sides.  Of note, I was very pleased with the final AP and lateral fluoroscopic images.  I then performed a final locking procedure of all 4 caps.  Of note, I did liberally irrigate the wound throughout the entire surgery. I did use a total of approximately 2 L of normal saline from start to finish.  At the termination of the procedure, there was no abnormal bleeding.  There was no extravasation of cerebrospinal fluid.  Of note, I did use neurologic monitoring throughout the surgery, and there was no sustained abnormal EMG activity noted from start to finish.  I was very pleased with the decompression and the appearance of the fusion construct.  At this point, the wound was closed in layers using #1 Vicryl followed by 2-0 Vicryl, followed by 3-0 Monocryl.  Benzoin and Steri-Strips were applied followed by sterile dressing.  All instrument counts were correct at the termination of the procedure.  Of note, Jason Coop was my assistant throughout surgery, and did aid in retraction, suctioning, and closure from start to finish.     Estill Bamberg, MD     MD/MEDQ  D:  11/28/2015  T:  11/28/2015  Job:  [id number]

## 2015-11-28 NOTE — Transfer of Care (Signed)
Immediate Anesthesia Transfer of Care Note  Patient: Annette Davis  Procedure(s) Performed: Procedure(s) with comments: RIGHT SIDED LUMBAR 4-5 TRANSFORAMINAL LUMBAR INTERBODY FUSION WITH INSTRUMENTATION AND ALLOGRAFT (Right) - RIGHT SIDED LUMBAR 4-5 TRANSFORAMINAL LUMBAR INTERBODY FUSION WITH INSTRUMENTATION AND ALLOGRAFT  Patient Location: PACU  Anesthesia Type:General  Level of Consciousness: awake  Airway & Oxygen Therapy: Patient Spontanous Breathing and Patient connected to nasal cannula oxygen  Post-op Assessment: Report given to RN and Post -op Vital signs reviewed and stable  Post vital signs: Reviewed and stable  Last Vitals:  Filed Vitals:   11/28/15 0647 11/28/15 1113  BP: 116/75 119/90  Pulse: 93 103  Temp: 36.3 C 36.4 C  Resp: 20 7    Complications: No apparent anesthesia complications

## 2015-11-28 NOTE — Anesthesia Postprocedure Evaluation (Signed)
Anesthesia Post Note  Patient: Annette Davis  Procedure(s) Performed: Procedure(s) (LRB): RIGHT SIDED LUMBAR 4-5 TRANSFORAMINAL LUMBAR INTERBODY FUSION WITH INSTRUMENTATION AND ALLOGRAFT (Right)  Patient location during evaluation: PACU Anesthesia Type: General Level of consciousness: awake and alert Pain management: pain level controlled Vital Signs Assessment: post-procedure vital signs reviewed and stable Respiratory status: spontaneous breathing, nonlabored ventilation and respiratory function stable Cardiovascular status: blood pressure returned to baseline and stable Postop Assessment: no signs of nausea or vomiting Anesthetic complications: no    Last Vitals:  Filed Vitals:   11/28/15 1215 11/28/15 1224  BP: 127/77 128/78  Pulse: 85 94  Temp:    Resp: 13     Last Pain:  Filed Vitals:   11/28/15 1301  PainSc: 4                  Jadrian Bulman,W. EDMOND

## 2015-11-29 NOTE — Progress Notes (Signed)
    Patient doing well, is sitting up in chair and feeling some better. She admits to having a rough night, very severe LBP and inability to get comfortable. She was given IV morphine for breakthrough pain and her BP dropped a bit. She is receiving IV fluids now and feeling much better. Describes resolved leg pain and is very pleased. Her LBP is her only complaint, She has not yet been up with PT. Has brace on and states this helps. Has not yet eaten. Passing gas and urine.  Physical Exam: BP 93/52 mmHg  Pulse 110  Temp(Src) 99 F (37.2 C) (Oral)  Resp 18  Ht  (1.651 m)  Wt 63.957 kg (141 lb)  BMI 23.46 kg/m2  SpO2 98%  Dressing in place NVI  POD #1 s/p L4-5 Decompression and Fusion for BIL L4 Radiculopathy, resolved leg pain PO day 1  - Brace education, TLSO, upper straps should be under arms not over shoulders so brace can be pulled tight  - up with PT/OT, encourage ambulation - Percocet for pain, Valium for muscle spasms  -written scripts in chart signed  - likely d/c home today vs tomorrow pending PT and px control

## 2015-11-29 NOTE — Evaluation (Signed)
Occupational Therapy Evaluation Patient Details Name: Annette Davis MRN: [id number] DOB: 1961/09/03 Today's Date: 11/29/2015    History of Present Illness Pt is a 55 y/o female who presents s/p L4-L5 PLIF on 11/28/15.   Clinical Impression   Pt admitted as above was educated in LB ADL's using AE vs PRN assist from her husband. She performed a simulated shower transfer using anterior/posterior approach & RW @ supervision level. Toilet transfers w/ supervision using 3:1 over toilet. Discussed and reviewed back precautions handout and compensatory techniques for daily activities in bathroom, kitchen and shower etc. Pt verbalized understanding and denies further acute OT needs as she states that she will have PRN assistance at home. Will sign off acute OT at this time.    Follow Up Recommendations  No OT follow up;Supervision/Assistance - 24 hour    Equipment Recommendations  3 in 1 bedside comode    Recommendations for Other Services       Precautions / Restrictions Precautions Precautions: Fall;Back Precaution Booklet Issued: Yes (comment) Precaution Comments: Reviewed handout Required Braces or Orthoses: Spinal Brace Spinal Brace: Lumbar corset;Applied in sitting position Restrictions Weight Bearing Restrictions: No      Mobility Bed Mobility               General bed mobility comments: Pt sitting up in the recliner upon OT arrival.   Transfers Overall transfer level: Needs assistance Equipment used: Rolling walker (2 wheeled) Transfers: Sit to/from Stand Sit to Stand: Min guard         General transfer comment: Steadying assist.    Balance Overall balance assessment: Needs assistance Sitting-balance support: Feet supported;No upper extremity supported Sitting balance-Leahy Scale: Fair     Standing balance support: Single extremity supported;During functional activity Standing balance-Leahy Scale: Fair                              ADL  Overall ADL's : Needs assistance/impaired     Grooming: Wash/dry hands;Wash/dry face;Supervision/safety;Standing   Upper Body Bathing: Supervision/ safety;Set up;Sitting   Lower Body Bathing: Min guard;Sitting/lateral leans;Sit to/from stand;Cueing for back precautions   Upper Body Dressing : Supervision/safety;Set up;Sitting   Lower Body Dressing: Minimal assistance;Sitting/lateral leans;Sit to/from stand;Cueing for back precautions   Toilet Transfer: Supervision/safety;BSC;RW;Ambulation   Toileting- Clothing Manipulation and Hygiene: Supervision/safety;Sitting/lateral lean Toileting - Clothing Manipulation Details (indicate cue type and reason): Pt was noted to maintain back precautions during hygiene Tub/ Shower Transfer: Walk-in shower;Anterior/posterior;Supervision/safety;Cueing for sequencing;Ambulation;Rolling walker (Simulated Anterior/posterior transfer, step up/over with RW) Tub/Shower Transfer Details (indicate cue type and reason): Pt has shower stool at home Functional mobility during ADLs: Min guard;Rolling walker;Cueing for sequencing (Pt was Min guard for initial sit to stand from chair, however supervision from toilet/3:1 and back to chair) General ADL Comments: Pt was assessed and then educated in LB ADL's using AE vs PRN assist from her husband. She performed a simulated shower transfer using anterior/posterior approach & RW @ supervision level. Toilet transfers w/ supervision using 3:1 over toilet. Discussed and reviewed back precautions handout and compensatory techniques for daily activities in bathroom, kitchen and shower etc. Pt verbalized understanding and denies further acute OT needs as she states that she will have PRN assistance at home.     Vision  No change from baseline   Perception     Praxis      Pertinent Vitals/Pain Pain Assessment: Faces Faces Pain Scale: Hurts little more Pain Location: Surgical site Pain Descriptors /  Indicators: Operative site  guarding;Guarding Pain Intervention(s): Limited activity within patient's tolerance;Monitored during session;Repositioned     Hand Dominance Right   Extremity/Trunk Assessment Upper Extremity Assessment Upper Extremity Assessment: Overall WFL for tasks assessed   Lower Extremity Assessment Lower Extremity Assessment: Defer to PT evaluation;Generalized weakness   Cervical / Trunk Assessment Cervical / Trunk Assessment: Normal   Communication Communication Communication: No difficulties   Cognition Arousal/Alertness: Awake/alert Behavior During Therapy: WFL for tasks assessed/performed Overall Cognitive Status: Within Functional Limits for tasks assessed                     General Comments       Exercises       Shoulder Instructions      Home Living Family/patient expects to be discharged to:: Private residence Living Arrangements: Spouse/significant other Available Help at Discharge: Family;Available 24 hours/day Type of Home: House Home Access: Level entry     Home Layout: Two level Alternate Level Stairs-Number of Steps: Flight   Bathroom Shower/Tub: Producer, television/film/video: Standard Bathroom Accessibility: Yes   Home Equipment: Museum/gallery curator          Prior Functioning/Environment Level of Independence: Independent             OT Diagnosis: Acute pain   OT Problem List:     OT Treatment/Interventions:      OT Goals(Current goals can be found in the care plan section) Acute Rehab OT Goals Patient Stated Goal: Home tomorrow w/ husband PRN assist OT Goal Formulation: All assessment and education complete, DC therapy  OT Frequency:     Barriers to D/C:            Co-evaluation              End of Session Equipment Utilized During Treatment: Gait belt;Rolling walker;Back brace Nurse Communication: Mobility status;Other (comment) (Recommend 3:1 for home)  Activity Tolerance: Patient tolerated treatment  well Patient left: in chair;with call bell/phone within reach   Time: 0950-1016 OT Time Calculation (min): 26 min Charges:  OT General Charges $OT Visit: 1 Procedure OT Evaluation $OT Eval Low Complexity: 1 Procedure OT Treatments $Self Care/Home Management : 8-22 mins G-Codes:    Alm Bustard, OTR/L 11/29/2015, 10:32 AM

## 2015-11-29 NOTE — Evaluation (Signed)
Physical Therapy Evaluation Patient Details Name: Annette Davis MRN: [id number] DOB: [birth year] Today's Date: 11/29/2015   History of Present Illness  Pt is a 55 y/o female who presents s/p L4-L5 PLIF on 11/28/15.  Clinical Impression  Pt admitted with above diagnosis. Pt currently with functional limitations due to the deficits listed below (see PT Problem List). At the time of PT eval pt was able to perform transfers and ambulation with min guard to min assist for balance and support. Of note, pt had what husband called a hypoglycemic episode when PT first entered, and pt reports feeling very weak and dizzy. Pt gave crackers and peanut butter and she reports immediately feeling better. RN aware. Pt will benefit from skilled PT to increase their independence and safety with mobility to allow discharge to the venue listed below.       Follow Up Recommendations Outpatient PT;Supervision for mobility/OOB    Equipment Recommendations  Rolling walker with 5" wheels (Depending on progress - SPC)    Recommendations for Other Services       Precautions / Restrictions Precautions Precautions: Fall;Back Precaution Booklet Issued: Yes (comment) Precaution Comments: Reviewed handout Required Braces or Orthoses: Spinal Brace Spinal Brace: Lumbar corset;Applied in sitting position Restrictions Weight Bearing Restrictions: No      Mobility  Bed Mobility               General bed mobility comments: Pt sitting up in the recliner upon PT arrival.   Transfers Overall transfer level: Needs assistance Equipment used: Rolling walker (2 wheeled);Straight cane Transfers: Sit to/from Stand Sit to Stand: Min assist         General transfer comment: Steadying assist. Pt with decreased safety with the SPC.  Ambulation/Gait Ambulation/Gait assistance: Min guard Ambulation Distance (Feet): 150 Feet Assistive device: Rolling walker (2 wheeled) Gait Pattern/deviations: Step-through  pattern;Decreased stride length Gait velocity: Decreased Gait velocity interpretation: Below normal speed for age/gender General Gait Details: Slow and guarded gait. VC's for improved posture.   Stairs            Wheelchair Mobility    Modified Rankin (Stroke Patients Only)       Balance Overall balance assessment: Needs assistance Sitting-balance support: Feet supported;No upper extremity supported Sitting balance-Leahy Scale: Fair     Standing balance support: Single extremity supported;During functional activity Standing balance-Leahy Scale: Fair                               Pertinent Vitals/Pain Pain Assessment: Faces Faces Pain Scale: Hurts even more Pain Location: Incision Pain Descriptors / Indicators: Operative site guarding;Aching Pain Intervention(s): Limited activity within patient's tolerance;Monitored during session;Repositioned    Home Living Family/patient expects to be discharged to:: Private residence Living Arrangements: Spouse/significant other Available Help at Discharge: Family;Available 24 hours/day Type of Home: House Home Access: Level entry     Home Layout: Two level Home Equipment: Shower seat;Crutches      Prior Function Level of Independence: Independent               Hand Dominance   Dominant Hand: Right    Extremity/Trunk Assessment   Upper Extremity Assessment: Defer to OT evaluation           Lower Extremity Assessment: Generalized weakness      Cervical / Trunk Assessment: Normal  Communication   Communication: No difficulties  Cognition Arousal/Alertness: Awake/alert Behavior During Therapy: WFL for tasks assessed/performed Overall Cognitive  Status: Within Functional Limits for tasks assessed                      General Comments      Exercises        Assessment/Plan    PT Assessment Patient needs continued PT services  PT Diagnosis Difficulty walking;Acute pain   PT  Problem List Decreased strength;Decreased range of motion;Decreased activity tolerance;Decreased balance;Decreased mobility;Decreased knowledge of use of DME;Decreased safety awareness;Decreased knowledge of precautions;Pain  PT Treatment Interventions DME instruction;Gait training;Stair training;Functional mobility training;Therapeutic activities;Therapeutic exercise;Neuromuscular re-education;Patient/family education   PT Goals (Current goals can be found in the Care Plan section) Acute Rehab PT Goals Patient Stated Goal: Pt did not state goals PT Goal Formulation: With patient/family Time For Goal Achievement: 12/06/15 Potential to Achieve Goals: Good    Frequency Min 5X/week   Barriers to discharge        Co-evaluation               End of Session Equipment Utilized During Treatment: Back brace Activity Tolerance: Patient limited by fatigue Patient left: in chair;with call bell/phone within reach Nurse Communication: Mobility status         Time: 4098-1191 PT Time Calculation (min) (ACUTE ONLY): 35 min   Charges:   PT Evaluation $PT Eval Moderate Complexity: 1 Procedure PT Treatments $Gait Training: 8-22 mins   PT G Codes:        Conni Slipper Dec 13, 2015, 9:43 AM   Conni Slipper, PT, DPT Acute Rehabilitation Services Pager: [phone]

## 2015-11-30 NOTE — Progress Notes (Signed)
Pt doing well. Pt and husband given D/C instructions with Rx's, verbal understanding was provided. Pt's IV was removed prior to D/C. Pt's incision is clean and dry with no sign of infection. Pt D/C'd home via wheelchair @ 1020 per MD order. Pt is stable @ D/C and has no other needs at this time. Rema Fendt, RN

## 2015-11-30 NOTE — Progress Notes (Signed)
Physical Therapy Treatment Patient Details Name: Annette Davis MRN: [id number] DOB: 1961-08-22 Today's Date: 11/30/2015    History of Present Illness Pt is a 55 y/o female who presents s/p L4-L5 PLIF on 11/28/15.    PT Comments    Patient mobilizing well this am. Patient tolerated increased ambulation distance and stair negotiation without difficulty. Educated on car transfers and mobility expectations. Patient with good carryover of precautions.  Follow Up Recommendations  Outpatient PT;Supervision for mobility/OOB     Equipment Recommendations  None recommended by PT (Depending on progress - SPC)    Recommendations for Other Services       Precautions / Restrictions Precautions Precautions: Fall;Back Precaution Booklet Issued: Yes (comment) Precaution Comments: Reviewed handout Required Braces or Orthoses: Spinal Brace Spinal Brace: Lumbar corset;Applied in sitting position Restrictions Weight Bearing Restrictions: No    Mobility  Bed Mobility               General bed mobility comments: Pt sitting up in the recliner upon OT arrival.   Transfers Overall transfer level: Needs assistance Equipment used: None Transfers: Sit to/from Stand Sit to Stand: Supervision         General transfer comment: performed without device  Ambulation/Gait Ambulation/Gait assistance: Supervision Ambulation Distance (Feet): 300 Feet Assistive device: Rolling walker (2 wheeled) Gait Pattern/deviations: Step-through pattern;Decreased stride length Gait velocity: Decreased Gait velocity interpretation: Below normal speed for age/gender General Gait Details: VCs for increased cadence with ambulation.   Stairs Stairs: Yes Stairs assistance: Supervision Stair Management: One rail Right;Step to pattern;Forwards Number of Stairs: 12 General stair comments: No physical assist required, VCs for sequencing and technique.   Wheelchair Mobility    Modified Rankin (Stroke  Patients Only)       Balance   Sitting-balance support: Feet supported Sitting balance-Leahy Scale: Fair       Standing balance-Leahy Scale: Fair                      Cognition Arousal/Alertness: Awake/alert Behavior During Therapy: WFL for tasks assessed/performed Overall Cognitive Status: Within Functional Limits for tasks assessed                      Exercises      General Comments General comments (skin integrity, edema, etc.): educated on car transfer technique, mobility expectations, and reinforced precautions      Pertinent Vitals/Pain Pain Assessment: Faces Faces Pain Scale: Hurts little more Pain Location: surgical site and tailbone Pain Descriptors / Indicators: Operative site guarding;Guarding Pain Intervention(s): Limited activity within patient's tolerance;Monitored during session;Repositioned    Home Living                      Prior Function            PT Goals (current goals can now be found in the care plan section) Acute Rehab PT Goals Patient Stated Goal: Home tomorrow w/ husband PRN assist PT Goal Formulation: With patient/family Time For Goal Achievement: 12/06/15 Potential to Achieve Goals: Good Progress towards PT goals: Progressing toward goals    Frequency  Min 5X/week    PT Plan Current plan remains appropriate    Co-evaluation             End of Session Equipment Utilized During Treatment: Back brace Activity Tolerance: Patient limited by fatigue Patient left: in chair;with call bell/phone within reach     Time: 0748-0806 PT Time Calculation (min) (ACUTE ONLY): 18  min  Charges:  $Gait Training: 8-22 mins                    G CodesFabio Davis 12/21/2015, 8:34 AM Annette Davis, PT DPT  [phone]

## 2015-12-05 NOTE — Discharge Summary (Signed)
Patient ID: Annette Davis MRN: [id number] DOB/AGE: 1961-01-09 55 y.o.  Admit date: 11/28/2015 Discharge date: 11/29/2015  Admission Diagnoses:  Active Problems:   Radiculopathy   Discharge Diagnoses:  Same  Past Medical History  Diagnosis Date  . Depression   . Hypoglycemia     pt. becomes faint if hypoglycemic   . Neuromuscular disorder (HCC)     radiculopathy - lumbar spine ,hyperventilater ventricular ?????- nervous system irregular, followed in [facility] [place] [place]  . Arthritis     radiculopathy- lumbar spine     Surgeries: Procedure(s): RIGHT SIDED LUMBAR 4-5 TRANSFORAMINAL LUMBAR INTERBODY FUSION WITH INSTRUMENTATION AND ALLOGRAFT on 11/28/2015   Consultants: None  Discharged Condition: Improved  Hospital Course: Annette Davis is an 55 y.o. female who was admitted 11/28/2015 for operative treatment of <principal problem not specified>. Patient has severe unremitting pain that affects sleep, daily activities, and work/hobbies. After pre-op clearance the patient was taken to the operating room on 11/28/2015 and underwent  Procedure(s): RIGHT SIDED LUMBAR 4-5 TRANSFORAMINAL LUMBAR INTERBODY FUSION WITH INSTRUMENTATION AND ALLOGRAFT.    Patient was given perioperative antibiotics: Anti-infectives    Start     Dose/Rate Route Frequency Ordered Stop   11/28/15 1520  ceFAZolin (ANCEF) IVPB 1 g/50 mL premix     1 g 100 mL/hr over 30 Minutes Intravenous Every 8 hours 11/28/15 1257 11/29/15 0002   11/28/15 0700  ceFAZolin (ANCEF) IVPB 2 g/50 mL premix     2 g 100 mL/hr over 30 Minutes Intravenous To [facility] 11/27/15 1009 11/28/15 0724       Patient was given sequential compression devices, early ambulation to prevent DVT.  Patient benefited maximally from hospital stay and there were no complications.    Recent vital signs: BP 104/64 mmHg  Pulse 115  Temp(Src) 98.1 F (36.7 C) (Oral)  Resp 18  Ht  (1.651 m)  Wt 63.957 kg (141 lb)  BMI 23.46  kg/m2  SpO2 96%  Discharge Medications:     Medication List    TAKE these medications        calcium-vitamin D 500-200 MG-UNIT tablet  Take 1 tablet by mouth 2 (two) times daily.     citalopram 20 MG tablet  Commonly known as:  CELEXA  Take 20 mg by mouth at bedtime.     MULTI-VITAMINS Tabs  Take 1 tablet by mouth daily.        Diagnostic Studies: Dg Chest 2 View  11/25/2015  CLINICAL DATA:  Preoperative study prior to lumbar fusion, nonsmoker. EXAM: CHEST  2 VIEW COMPARISON:  Chest CT scan of October 02, 2015 FINDINGS: The lungs are well-expanded and clear. The heart and pulmonary vascularity are normal. The mediastinum is normal in width. There is no pleural effusion. The observed bony thorax exhibits no acute abnormality. There is gentle levocurvature centered at approximately T9. IMPRESSION: There is no active cardiopulmonary disease. Electronically Signed   By: David  [place] M.D.   On: 11/25/2015 13:57   Dg Lumbar Spine 2-3 Views  11/28/2015  CLINICAL DATA:  Patient for L4-5 transforaminal lumbar fusion. EXAM: LUMBAR SPINE - 2-3 VIEW; DG C-ARM 61-120 MIN COMPARISON:  Lumbar spine MRI 09/18/2015 FINDINGS: Two intraoperative fluoroscopic images were submitted demonstrating posterior spinal fusion hardware L4-5 with intervertebral body spacer in place. No evidence for acute abnormality. IMPRESSION: Patient status post L4-5 fusion. Electronically Signed   By: Annia Belt M.D.   On: 11/28/2015 11:16   Dg Lumbar Spine 2-3 Views  11/28/2015  CLINICAL DATA:  Radiculopathy EXAM: LUMBAR SPINE - 2-3 VIEW COMPARISON:  09/18/2015 MRI FINDINGS: Spinal numbering as on preoperative study. Posterior spinal needles project at the L4 and L5 spinous processes. Stable spinal alignment and disc height. IMPRESSION: Spinal needles project at the L4 and L5 spinous processes. Electronically Signed   By: Marnee Spring M.D.   On: 11/28/2015 09:41   Dg C-arm 61-120 Min  11/28/2015  CLINICAL DATA:  Patient  for L4-5 transforaminal lumbar fusion. EXAM: LUMBAR SPINE - 2-3 VIEW; DG C-ARM 61-120 MIN COMPARISON:  Lumbar spine MRI 09/18/2015 FINDINGS: Two intraoperative fluoroscopic images were submitted demonstrating posterior spinal fusion hardware L4-5 with intervertebral body spacer in place. No evidence for acute abnormality. IMPRESSION: Patient status post L4-5 fusion. Electronically Signed   By: Annia Belt M.D.   On: 11/28/2015 11:16   Mm Outside Films Mammo  11/21/2015  This examination belongs to an outside facility and is stored here for comparison purposes only.  Contact the originating outside institution for any associated report or interpretation.   Disposition: 01-Home or Self Care   POD #1 s/p L4-5 Decompression and Fusion for BIL L4 Radiculopathy, resolved leg pain PO day 1  - Brace education, TLSO, upper straps should be under arms not over shoulders so brace can be pulled tight  - up with PT/OT, encourage ambulation - Percocet for pain, Valium for muscle spasms -Written scripts for pain signed and in chart -D/C instructions sheet printed and in chart -D/C today  -F/U in office 2 weeks   Signed: Georga Bora 12/05/2015, 11:07 AM

## 2016-06-17 ENCOUNTER — Ambulatory Visit: Payer: Self-pay | Attending: Oncology | Admitting: *Deleted

## 2016-06-17 ENCOUNTER — Encounter (INDEPENDENT_AMBULATORY_CARE_PROVIDER_SITE_OTHER): Payer: Self-pay

## 2016-06-17 ENCOUNTER — Encounter: Payer: Self-pay | Admitting: *Deleted

## 2016-06-17 VITALS — Temp 97.9°F

## 2016-06-17 DIAGNOSIS — R920 Mammographic microcalcification found on diagnostic imaging of breast: Secondary | ICD-10-CM

## 2016-06-17 NOTE — Patient Instructions (Signed)
Gave patient hand-out, Women Staying Healthy, Active and Well from [facility], with education on breast health, pap smears, heart and colon health. 

## 2016-06-17 NOTE — Progress Notes (Signed)
Subjective:     Patient ID: Annette Davis, female   DOB: 03/03/1961, 55 y.o.   MRN: [id number]  HPI   Review of Systems     Objective:   Physical Exam  Pulmonary/Chest: Right breast exhibits no inverted nipple, no mass, no nipple discharge, no skin change and no tenderness. Left breast exhibits no mass, no nipple discharge, no skin change and no tenderness. Breasts are symmetrical.         Assessment:     [name]  Last mammogram on 12/27/14 was a birads 3 for calcifications.  Last pap on 11/26/15 was negative/negative.  On clinical breast exam there is no dominant mass, skin changes, nipple discharge or lymphadenopathy.  Taught self breast awareness.  Patient has been screened for eligibility.  She does not have any insurance, Medicare or Medicaid.  She also meets financial eligibility.  Hand-out given on the Affordable Care Act.    Plan:     Bilateral diagnostic mammogram ordered.  Annette Davis will schedule patient her appointment.  Will follow-up per [facility] protocol.

## 2016-07-02 ENCOUNTER — Ambulatory Visit
Admission: RE | Admit: 2016-07-02 | Discharge: 2016-07-02 | Disposition: A | Discharge status: Home / Self Care | Payer: Self-pay | Source: Ambulatory Visit | Attending: Oncology | Admitting: Oncology

## 2016-07-02 DIAGNOSIS — R920 Mammographic microcalcification found on diagnostic imaging of breast: Secondary | ICD-10-CM

## 2016-08-06 ENCOUNTER — Encounter: Payer: Self-pay | Admitting: *Deleted

## 2016-08-06 NOTE — Progress Notes (Signed)
Letter mailed from the [facility] to inform patient of her normal mammogram results.  Patient is to follow-up with annual screening in one year.  HSIS to Christy. 

## 2017-02-16 ENCOUNTER — Other Ambulatory Visit: Payer: Self-pay | Admitting: Internal Medicine

## 2017-02-16 DIAGNOSIS — R3129 Other microscopic hematuria: Secondary | ICD-10-CM

## 2017-02-18 ENCOUNTER — Ambulatory Visit
Admission: RE | Admit: 2017-02-18 | Discharge: 2017-02-18 | Disposition: A | Discharge status: Home / Self Care | Payer: Self-pay | Source: Ambulatory Visit | Attending: Internal Medicine | Admitting: Internal Medicine

## 2017-02-18 DIAGNOSIS — R3129 Other microscopic hematuria: Secondary | ICD-10-CM | POA: Insufficient documentation

## 2017-08-30 ENCOUNTER — Other Ambulatory Visit: Payer: Self-pay

## 2017-08-30 ENCOUNTER — Ambulatory Visit: Payer: Self-pay | Attending: Oncology

## 2017-08-30 ENCOUNTER — Ambulatory Visit
Admission: RE | Admit: 2017-08-30 | Discharge: 2017-08-30 | Disposition: A | Discharge status: Home / Self Care | Payer: Self-pay | Source: Ambulatory Visit | Attending: Oncology | Admitting: Oncology

## 2017-08-30 ENCOUNTER — Encounter (INDEPENDENT_AMBULATORY_CARE_PROVIDER_SITE_OTHER): Payer: Self-pay

## 2017-08-30 VITALS — BP 114/72 | HR 90 | Temp 97.7°F | Ht 65.0 in | Wt 143.0 lb

## 2017-08-30 DIAGNOSIS — Z0000 Encounter for general adult medical examination without abnormal findings: Secondary | ICD-10-CM

## 2017-08-30 NOTE — Progress Notes (Signed)
Subjective:     Patient ID: Annette Davis, female   DOB: 07/29/1961, 56 y.o.   MRN: [id number]  HPI   Review of Systems     Objective:   Physical Exam  Pulmonary/Chest: Right breast exhibits no inverted nipple, no mass, no nipple discharge, no skin change and no tenderness. Left breast exhibits no inverted nipple, no mass, no nipple discharge, no skin change and no tenderness. Breasts are symmetrical.       Assessment:     56 year [facility].  She had a Birads 2 mammogram 07/02/16.  Patient screened, and meets [facility] eligibility.  Patient does not have insurance, Medicare or Medicaid.  Handout given on Affordable Care Act.  Instructed patient on breast self-exam using teach back method.  CBE unremarkable.  No mass or lump palpated.      Plan:     Sent for bilateral screening mammogram.

## 2017-08-30 NOTE — Progress Notes (Signed)
Letter mailed from [facility] to notify of normal mammogram results.  Patient to return in one year for annual screening.  Copy to HSIS. 

## 2018-04-25 ENCOUNTER — Other Ambulatory Visit: Payer: Self-pay | Admitting: Internal Medicine

## 2018-04-25 DIAGNOSIS — Z1239 Encounter for other screening for malignant neoplasm of breast: Secondary | ICD-10-CM

## 2018-10-03 ENCOUNTER — Ambulatory Visit
Admission: RE | Admit: 2018-10-03 | Discharge: 2018-10-03 | Disposition: A | Discharge status: Home / Self Care | Payer: Self-pay | Source: Ambulatory Visit | Attending: Oncology | Admitting: Oncology

## 2018-10-03 ENCOUNTER — Encounter: Payer: Self-pay | Admitting: *Deleted

## 2018-10-03 ENCOUNTER — Ambulatory Visit: Payer: Self-pay | Attending: Oncology | Admitting: *Deleted

## 2018-10-03 VITALS — BP 116/76 | HR 74 | Temp 98.1°F | Ht 66.0 in | Wt 145.3 lb

## 2018-10-03 DIAGNOSIS — Z0000 Encounter for general adult medical examination without abnormal findings: Secondary | ICD-10-CM

## 2018-10-03 NOTE — Patient Instructions (Signed)
Gave patient hand-out, Women Staying Healthy, Active and Well from [facility], with education on breast health, pap smears, heart and colon health. 

## 2018-10-03 NOTE — Progress Notes (Addendum)
  Subjective:     Patient ID: Carolyn StareLinda Laurie, female   DOB: 02/23/1961, 57 y.o.   MRN: [id number]  HPI   Review of Systems     Objective:   Physical Exam Chest:     Breasts:        Right: No swelling, bleeding, inverted nipple, mass, nipple discharge, skin change or tenderness.        Left: No swelling, bleeding, inverted nipple, mass, nipple discharge, skin change or tenderness.        Assessment:     57 year old White [facility]  Patient's daughter is a Contractordesigner for [employer] [place].  Clinical breast exam unremarkable.  Taught self breast awareness.  Last pap on 11/13/15 was negative / negative.  Endocervical component was present on the pap results.  Patient states she had a total hysterectomy 6 years ago.  Offered to do a pelvic exam to see if she possibly had a supracervical hysterectomy.  Patient refused exam today.  Next pap will be due in 2022.  Patient has been screened for eligibility.  She does not have any insurance, Medicare or Medicaid.  She also meets financial eligibility.  Hand-out given on the Affordable Care Act.  Risk Assessment    Risk Scores      10/03/2018   Last edited by: Alta Corningover, Melissa G, CMA   5-year risk: 2.5 %   Lifetime risk: 13.7 %            Plan:     Screening mammogram ordered.  Will follow-up per [facility] protocol.

## 2018-10-04 ENCOUNTER — Encounter: Payer: Self-pay | Admitting: *Deleted

## 2018-10-04 NOTE — Progress Notes (Signed)
Letter mailed from the [facility] to inform patient of her normal mammogram results.  Patient is to follow-up with annual screening in one year.  HSIS to Christy. 

## 2019-12-15 DIAGNOSIS — L920 Granuloma annulare: Secondary | ICD-10-CM | POA: Insufficient documentation

## 2019-12-28 ENCOUNTER — Ambulatory Visit: Payer: Self-pay | Admitting: Dermatology

## 2020-01-08 ENCOUNTER — Encounter: Payer: Self-pay | Admitting: Urology

## 2020-01-08 ENCOUNTER — Other Ambulatory Visit: Payer: Self-pay

## 2020-01-08 ENCOUNTER — Ambulatory Visit (INDEPENDENT_AMBULATORY_CARE_PROVIDER_SITE_OTHER): Payer: Self-pay | Admitting: Urology

## 2020-01-08 VITALS — BP 113/74 | HR 74 | Ht 66.0 in | Wt 144.0 lb

## 2020-01-08 DIAGNOSIS — R319 Hematuria, unspecified: Secondary | ICD-10-CM

## 2020-01-08 DIAGNOSIS — R3129 Other microscopic hematuria: Secondary | ICD-10-CM

## 2020-01-08 LAB — MICROSCOPIC EXAMINATION: Bacteria, UA: NONE SEEN

## 2020-01-08 LAB — URINALYSIS, COMPLETE
Bilirubin, UA: NEGATIVE
Glucose, UA: NEGATIVE
Ketones, UA: NEGATIVE
Nitrite, UA: NEGATIVE
Protein,UA: NEGATIVE
Specific Gravity, UA: 1.025 (ref 1.005–1.030)
Urobilinogen, Ur: 0.2 mg/dL (ref 0.2–1.0)
pH, UA: 6 (ref 5.0–7.5)

## 2020-01-08 NOTE — Progress Notes (Signed)
01/08/2020 8:55 AM   Annette Davis 1960-12-25 [id number]  Referring provider: Tracie Harrier, [facility] [place],  [place] [address]  No chief complaint on file.   HPI: I was consulted to assess the patient's microscopic hematuria.  She voids every 2 hours gets up once a night.  She is continent.  She does not take daily aspirin or blood thinners.  She has not smoked.  She has had back surgery and hysterectomy.  No history of previous GU surgery kidney stones or bladder infections   PMH: Past Medical History:  Diagnosis Date  . Arthritis    radiculopathy- lumbar spine   . Depression   . Hypoglycemia    pt. becomes faint if hypoglycemic   . Neuromuscular disorder (HCC)    radiculopathy - lumbar spine ,hyperventilater ventricular ?????- nervous system irregular, followed in [facility] [place]  . Osteoporosis     Surgical History: Past Surgical History:  Procedure Laterality Date  . ABDOMINAL HYSTERECTOMY  2014  . CESAREAN SECTION  1994  . GANGLION CYST EXCISION Left 1996    Home Medications:  Allergies as of 01/08/2020      Reactions   Daucus Carota Itching, Swelling   THROAT REACTION: Itching,swelling throat Cannot swallow raw carrots   Sulfa Antibiotics Nausea And Vomiting      Medication List       Accurate as of January 08, 2020  8:55 AM. If you have any questions, ask your nurse or doctor.        alendronate 70 MG tablet Commonly known as: FOSAMAX Take by mouth.   calcium-vitamin D 500-200 MG-UNIT tablet Take 1 tablet by mouth 2 (two) times daily.   citalopram 20 MG tablet Commonly known as: CELEXA Take 20 mg by mouth at bedtime.   Multi-Vitamins Tabs Take 1 tablet by mouth daily.       Allergies:  Allergies  Allergen Reactions  . Daucus Carota Itching and Swelling    THROAT REACTION: Itching,swelling throat Cannot swallow raw carrots  . Sulfa Antibiotics Nausea And Vomiting    Family  History: Family History  Problem Relation Age of Onset  . Breast cancer Mother 15    Social History:  reports that she has never smoked. She does not have any smokeless tobacco history on file. She reports current alcohol use. She reports that she does not use drugs.  ROS:                                        Physical Exam: There were no vitals taken for this visit.  Constitutional:  Alert and oriented, No acute distress. HEENT: [place] AT, moist mucus membranes.  Trachea midline, no masses. Cardiovascular: No clubbing, cyanosis, or edema. Respiratory: Normal respiratory effort, no increased work of breathing. GI: Abdomen is soft, nontender, nondistended, no abdominal masses GU: No CVA tenderness.  No abdominal tenderness Skin: No rashes, bruises or suspicious lesions. Lymph: No cervical or inguinal adenopathy. Neurologic: Grossly intact, no focal deficits, moving all 4 extremities. Psychiatric: Normal mood and affect.  Laboratory Data: Lab Results  Component Value Date   WBC 11.2 (H) 09/16/2015   HGB 13.6 09/16/2015   HCT 40.5 09/16/2015   MCV 86.7 09/16/2015   PLT 203 09/16/2015    Lab Results  Component Value Date   CREATININE 0.64 09/16/2015    No results found for:  PSA  No results found for: TESTOSTERONE  No results found for: HGBA1C  Urinalysis    Component Value Date/Time   COLORURINE YELLOW 11/25/2015 1131   APPEARANCEUR CLEAR 11/25/2015 1131   LABSPEC 1.022 11/25/2015 1131   PHURINE 5.5 11/25/2015 1131   GLUCOSEU NEGATIVE 11/25/2015 1131   HGBUR MODERATE (A) 11/25/2015 1131   BILIRUBINUR NEGATIVE 11/25/2015 1131   KETONESUR NEGATIVE 11/25/2015 1131   PROTEINUR NEGATIVE 11/25/2015 1131   NITRITE NEGATIVE 11/25/2015 1131   LEUKOCYTESUR SMALL (A) 11/25/2015 1131    Pertinent Imaging: No x-ray: Normal renal ultrasound from chart review 2018.  Urinalysis reviewed.  CT scan ordered  Assessment & Plan: Reassess in the next 2 to 3  weeks with CT scan and for cystoscopy.  She has mild stable nocturia and frequency  1. Hematuria, unspecified type  - Urinalysis, Complete   No follow-ups on file.  Martina Sinner, MD  [facility] [address] [place], [place] [address] [phone]

## 2020-01-23 ENCOUNTER — Ambulatory Visit: Payer: Self-pay | Attending: Oncology

## 2020-01-23 ENCOUNTER — Ambulatory Visit
Admission: RE | Admit: 2020-01-23 | Discharge: 2020-01-23 | Disposition: A | Discharge status: Home / Self Care | Payer: Self-pay | Source: Ambulatory Visit | Attending: Oncology | Admitting: Oncology

## 2020-01-23 ENCOUNTER — Other Ambulatory Visit: Payer: Self-pay

## 2020-01-23 VITALS — BP 117/76 | HR 78 | Temp 97.6°F | Ht 65.5 in | Wt 141.9 lb

## 2020-01-23 DIAGNOSIS — Z0000 Encounter for general adult medical examination without abnormal findings: Secondary | ICD-10-CM | POA: Insufficient documentation

## 2020-01-23 NOTE — Progress Notes (Signed)
  Subjective:     Patient ID: Annette Davis, female   DOB: [birth year], 59 y.o.   MRN: [id number]  HPI   Review of Systems     Objective:   Physical Exam Chest:     Breasts:        Right: No swelling, bleeding, inverted nipple, mass, nipple discharge, skin change or tenderness.        Left: No swelling, bleeding, inverted nipple, mass, nipple discharge, skin change or tenderness.        Assessment:     59 year old patient presents for [facility] clinic visit.  Patient screened, and meets [facility] eligibility.  Patient does not have insurance, Medicare or Medicaid. Instructed patient on breast self awareness using teach back method.  Clinical breast exam unremarkable. No mass or lump palpated.  Risk Assessment    Risk Scores      01/23/2020 10/03/2018   Last edited by: Alta Corning, CMA [place], [place], [place]   5-year risk: 2.6 % 2.5 %   Lifetime risk: 13.4 % 13.7 %             Plan:     Sent for bilateral screening mammogram.

## 2020-01-24 ENCOUNTER — Other Ambulatory Visit: Payer: Self-pay | Admitting: *Deleted

## 2020-01-24 DIAGNOSIS — N63 Unspecified lump in unspecified breast: Secondary | ICD-10-CM

## 2020-01-26 ENCOUNTER — Other Ambulatory Visit: Payer: Self-pay

## 2020-01-26 ENCOUNTER — Ambulatory Visit
Admission: RE | Admit: 2020-01-26 | Discharge: 2020-01-26 | Disposition: A | Discharge status: Home / Self Care | Payer: Self-pay | Source: Ambulatory Visit | Attending: Urology | Admitting: Urology

## 2020-01-26 DIAGNOSIS — R319 Hematuria, unspecified: Secondary | ICD-10-CM | POA: Insufficient documentation

## 2020-01-26 MED ORDER — IOHEXOL 300 MG/ML  SOLN
100.0000 mL | Freq: Once | INTRAMUSCULAR | Status: AC | PRN
  Administered 2020-01-26: 100 mL via INTRAVENOUS

## 2020-02-01 ENCOUNTER — Ambulatory Visit
Admission: RE | Admit: 2020-02-01 | Discharge: 2020-02-01 | Disposition: A | Discharge status: Home / Self Care | Payer: Self-pay | Source: Ambulatory Visit | Attending: Oncology | Admitting: Oncology

## 2020-02-01 DIAGNOSIS — N63 Unspecified lump in unspecified breast: Secondary | ICD-10-CM | POA: Insufficient documentation

## 2020-02-01 NOTE — Progress Notes (Signed)
Radiologist reviewed Birads 1 results with patient.  Letter mailed from [facility] to notify of normal mammogram results.  Patient to return in one year for annual screening.  Copy to HSIS.

## 2020-02-05 ENCOUNTER — Encounter: Payer: Self-pay | Admitting: Urology

## 2020-02-05 ENCOUNTER — Other Ambulatory Visit: Payer: Self-pay

## 2020-02-05 ENCOUNTER — Ambulatory Visit (INDEPENDENT_AMBULATORY_CARE_PROVIDER_SITE_OTHER): Payer: Self-pay | Admitting: Urology

## 2020-02-05 VITALS — BP 113/69 | HR 71

## 2020-02-05 DIAGNOSIS — R3129 Other microscopic hematuria: Secondary | ICD-10-CM

## 2020-02-05 DIAGNOSIS — K769 Liver disease, unspecified: Secondary | ICD-10-CM

## 2020-02-05 LAB — URINALYSIS, COMPLETE
Bilirubin, UA: NEGATIVE
Glucose, UA: NEGATIVE
Ketones, UA: NEGATIVE
Leukocytes,UA: NEGATIVE
Nitrite, UA: NEGATIVE
Protein,UA: NEGATIVE
Specific Gravity, UA: 1.025 (ref 1.005–1.030)
Urobilinogen, Ur: 0.2 mg/dL (ref 0.2–1.0)
pH, UA: 6 (ref 5.0–7.5)

## 2020-02-05 LAB — MICROSCOPIC EXAMINATION: Bacteria, UA: NONE SEEN

## 2020-02-05 NOTE — Patient Instructions (Signed)
Will call with results

## 2020-02-05 NOTE — Progress Notes (Signed)
02/05/2020 10:07 AM   Annette Davis Feb 28, 1961 [id number]  Referring provider: Barbette Reichmann, MD [address] [facility] [place],  [place] [address]  Chief Complaint  Patient presents with  . Cysto    HPI: I was consulted to assess the patient's microscopic hematuria.  She voids every 2 hours gets up once a night.  She is continent.  She does not take daily aspirin or blood thinners.  She has not smoked.  Today Mild frequency stable.  Nocturia stable.  No gross hematuria CT scan demonstrated normal kidneys.  She had a 14 mm lesion lateral hepatic dome and MRI with and without contrast recommended No recent urine culture  Cystoscopy: Patient underwent flexible cystoscopy utilizing sterile technique.  Bladder mucosa and trigone were normal.  No stitch foreign body or carcinoma.  Procedure well-tolerated.  Reasonly well supported bladder neck with no prolapse or stress incontinence    PMH: Past Medical History:  Diagnosis Date  . Arthritis    radiculopathy- lumbar spine   . Depression   . Hypoglycemia    pt. becomes faint if hypoglycemic   . Neuromuscular disorder (HCC)    radiculopathy - lumbar spine ,hyperventilater ventricular ?????- nervous system irregular, followed in [facility] [place] [place]  . Osteoporosis     Surgical History: Past Surgical History:  Procedure Laterality Date  . ABDOMINAL HYSTERECTOMY  2014  . CESAREAN SECTION  1994  . GANGLION CYST EXCISION Left 1996    Home Medications:  Allergies as of 02/05/2020      Reactions   Daucus Carota Itching, Swelling   THROAT REACTION: Itching,swelling throat Cannot swallow raw carrots   Sulfa Antibiotics Nausea And Vomiting      Medication List       Accurate as of Feb 05, 2020 10:07 AM. If you have any questions, ask your nurse or doctor.        STOP taking these medications   citalopram 20 MG tablet Commonly known as: CELEXA Stopped by: Martina Sinner, MD     Multi-Vitamins Tabs Stopped by: Martina Sinner, MD     TAKE these medications   alendronate 70 MG tablet Commonly known as: FOSAMAX Take by mouth.   calcium-vitamin D 500-200 MG-UNIT tablet Take 1 tablet by mouth 2 (two) times daily.   venlafaxine XR 37.5 MG 24 hr capsule Commonly known as: EFFEXOR-XR Take by mouth.       Allergies:  Allergies  Allergen Reactions  . Daucus Carota Itching and Swelling    THROAT REACTION: Itching,swelling throat Cannot swallow raw carrots  . Sulfa Antibiotics Nausea And Vomiting    Family History: Family History  Problem Relation Age of Onset  . Breast cancer Mother 63    Social History:  reports that she has never smoked. She has never used smokeless tobacco. She reports current alcohol use. She reports that she does not use drugs.  ROS:                                        Physical Exam: There were no vitals taken for this visit.  Constitutional:  Alert and oriented, No acute distress.   Laboratory Data: Lab Results  Component Value Date   WBC 11.2 (H) 09/16/2015   HGB 13.6 09/16/2015   HCT 40.5 09/16/2015   MCV 86.7 09/16/2015   PLT 203 09/16/2015    Lab Results  Component Value Date   CREATININE 0.64 09/16/2015    No results found for: PSA  No results found for: TESTOSTERONE  No results found for: HGBA1C  Urinalysis    Component Value Date/Time   COLORURINE YELLOW 11/25/2015 [place] (A) 01/08/2020 0905   LABSPEC 1.022 11/25/2015 1131   PHURINE 5.5 11/25/2015 1131   GLUCOSEU Negative 01/08/2020 0905   HGBUR MODERATE (A) 11/25/2015 1131   BILIRUBINUR Negative 01/08/2020 0905   KETONESUR NEGATIVE 11/25/2015 1131   PROTEINUR Negative 01/08/2020 0905   PROTEINUR NEGATIVE 11/25/2015 1131   NITRITE Negative 01/08/2020 0905   NITRITE NEGATIVE 11/25/2015 1131   LEUKOCYTESUR Trace (A) 01/08/2020 0905    Pertinent Imaging:   Assessment & Plan: Patient has  been cleared for microscopic hematuria.  MRI of abdomen to evaluate liver with and without contrast ordered.  I will call with the results  There are no diagnoses linked to this encounter.  No follow-ups on file.  Reece Packer, MD  [facility] [address] [place], [place] [address] [phone]

## 2020-02-05 NOTE — Addendum Note (Signed)
Addended by: Milas Kocher A on: 02/05/2020 10:57 AM   Modules accepted: Orders

## 2020-02-25 ENCOUNTER — Ambulatory Visit
Admission: RE | Admit: 2020-02-25 | Discharge: 2020-02-25 | Disposition: A | Discharge status: Home / Self Care | Payer: Self-pay | Source: Ambulatory Visit | Attending: Urology | Admitting: Urology

## 2020-02-25 ENCOUNTER — Other Ambulatory Visit: Payer: Self-pay

## 2020-02-25 DIAGNOSIS — K769 Liver disease, unspecified: Secondary | ICD-10-CM | POA: Insufficient documentation

## 2020-02-25 MED ORDER — GADOBUTROL 1 MMOL/ML IV SOLN
6.0000 mL | Freq: Once | INTRAVENOUS | Status: AC | PRN
  Administered 2020-02-25: 6 mL via INTRAVENOUS

## 2020-03-06 ENCOUNTER — Telehealth: Payer: Self-pay

## 2020-03-06 NOTE — Telephone Encounter (Signed)
Patient called stating she would like her results of MRI, please advise thanks

## 2020-03-10 NOTE — Telephone Encounter (Signed)
Please but the patient no that the liver lesion is benign; there was no carcinoma; it does not need follow-up Apologize for me not calling her thank you

## 2020-03-12 NOTE — Telephone Encounter (Signed)
Called pt no answer. Left detailed message for pt per DPR. Advised pt to call back for questions or concerns.  

## 2020-06-21 IMAGING — MR MR ABDOMEN WO/W CM
18 series · 48 of 48 positions shown · IV contrast (gadavist)
Comparison: CT abdomen pelvis, 01/26/2020

CLINICAL DATA: Liver lesion

EXAM:
MRI ABDOMEN WITHOUT AND WITH CONTRAST
TECHNIQUE: Multiplanar multisequence MR imaging of the abdomen was performed
both before and after the administration of intravenous contrast.
CONTRAST:  6mL GADAVIST GADOBUTROL 1 MMOL/ML IV SOLN

[Series 3: T2 · coronal · 6.0mm · 1.19mm/px · 2 of 30 slices shown (1 of 2)]
[im 1/30]
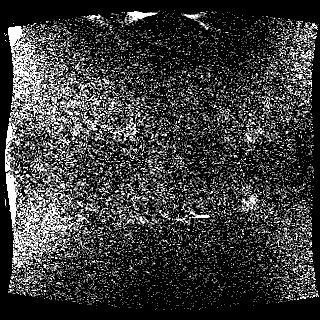
[im 30/30]
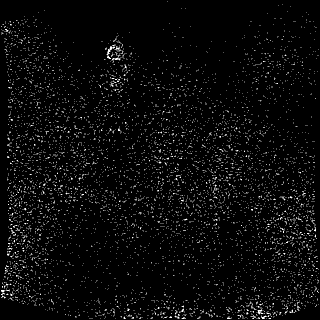

[Series 4: T2 · axial · 6.0mm · 1.19mm/px · z∈[-124,+99]mm · 2 of 32 slices shown (2 of 2)]
[im 1/32]
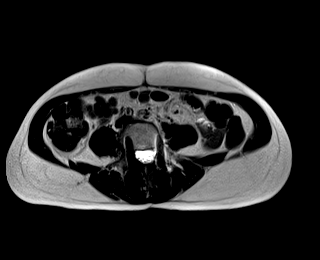
[im 32/32]
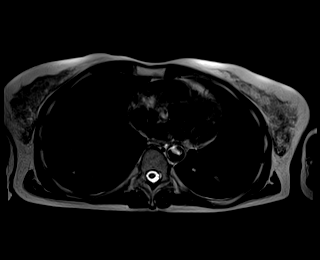

[Series 6: T2 fat-sat · axial · 6.0mm · 1.19mm/px · z∈[-121,+117]mm · 2 of 34 slices shown]
[im 1/34]
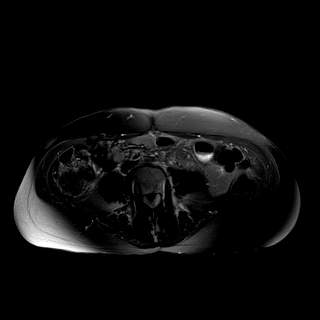
[im 34/34]
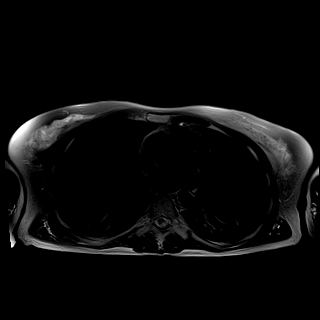

[Series 7: ax dwi_tracew · axial · 6.0mm · 1.42mm/px · z∈[-121,+117]mm · 5 of 102 slices shown]
[im 1/102]
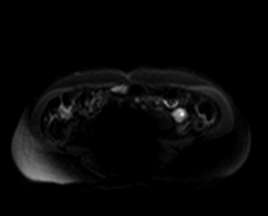
[im 26/102]
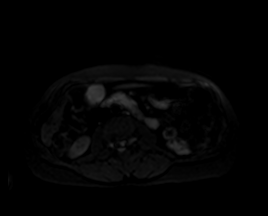
[im 51/102]
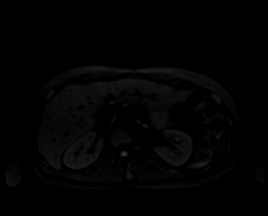
[im 76/102]
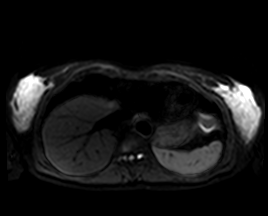
[im 102/102]
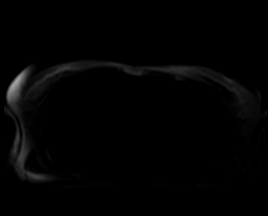

[Series 8: ax dwi_adc · axial · 6.0mm · 1.42mm/px · z∈[-121,+117]mm · 2 of 34 slices shown]
[im 1/34]
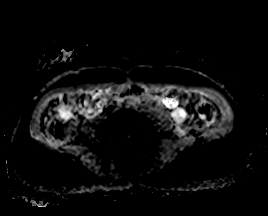
[im 34/34]
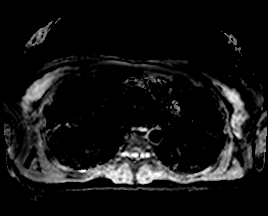

[Series 9: T1 · axial · 6.0mm · 0.74mm/px · z∈[-124,+99]mm · 2 of 32 slices shown (1 of 2)]
[im 1/32]
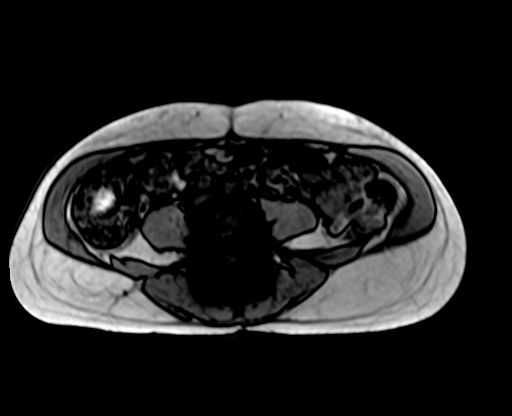
[im 32/32]
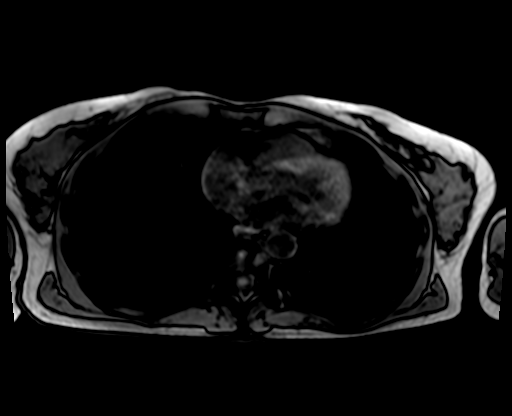

[Series 9: T1 · axial · 6.0mm · 0.74mm/px · z∈[-124,+99]mm · 2 of 32 slices shown (2 of 2)]
[im 1/32]
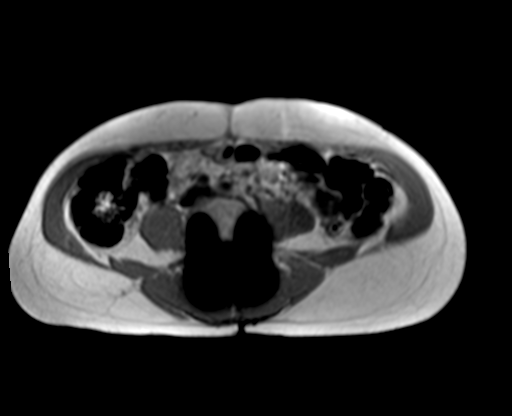
[im 32/32]
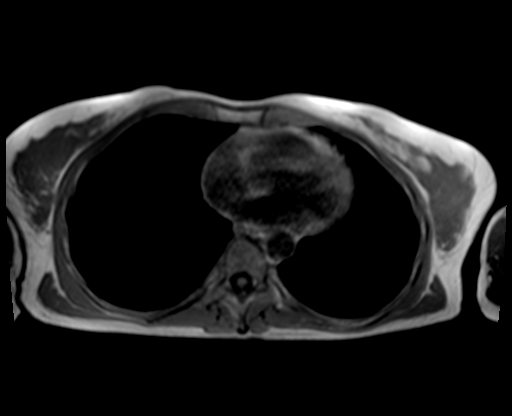

[Series 10: bSSFP · axial · 6.0mm · 0.74mm/px · 1 of 32 slices shown]
[im 1/32]
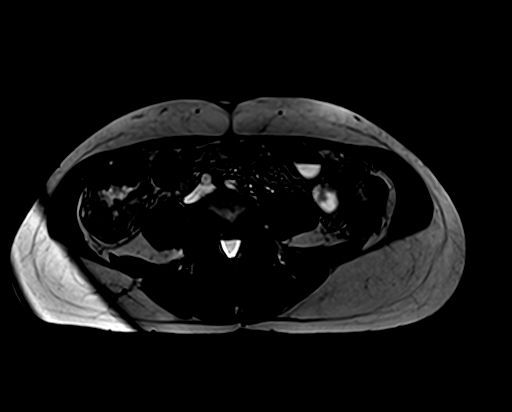

[Series 11: T1 dynamic fat-sat · axial · non-contrast · 3.0mm · 1.19mm/px · z∈[-126,+111]mm · 3 of 80 slices shown (1 of 5)]
[im 1/80]
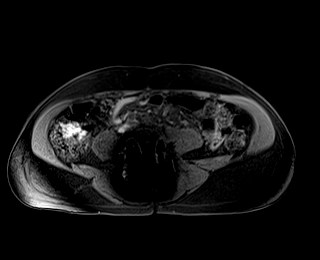
[im 40/80]
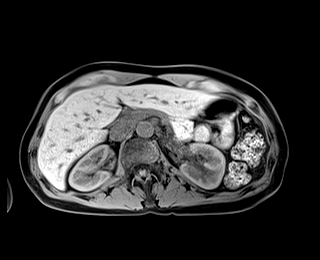
[im 80/80]
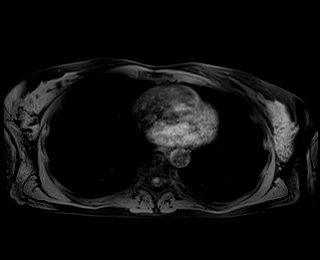

[Series 12: T1 dynamic fat-sat post-contrast · axial · 3.0mm · 1.19mm/px · z∈[-126,+111]mm · 3 of 80 slices shown (1 of 4)]
[im 1/80]
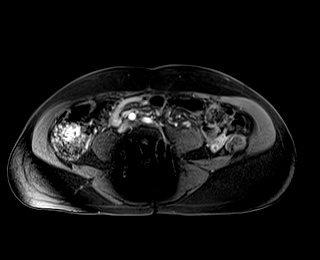
[im 40/80]
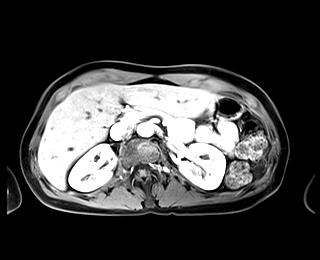
[im 80/80]
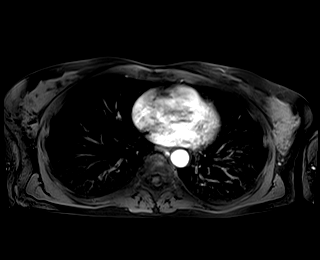

[Series 13: T1 dynamic fat-sat · axial · 3.0mm · 1.19mm/px · z∈[-126,+111]mm · 3 of 80 slices shown (2 of 5)]
[im 1/80]
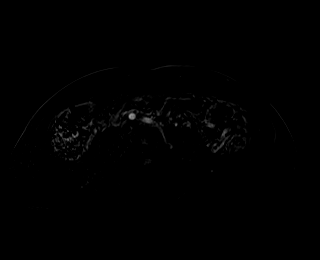
[im 40/80]
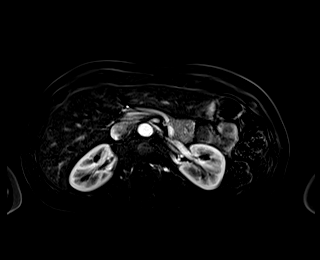
[im 80/80]
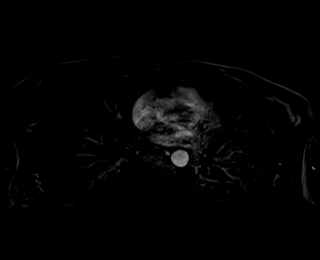

[Series 14: T1 dynamic fat-sat post-contrast · axial · 3.0mm · 1.19mm/px · z∈[-126,+111]mm · 3 of 80 slices shown (2 of 4)]
[im 1/80]
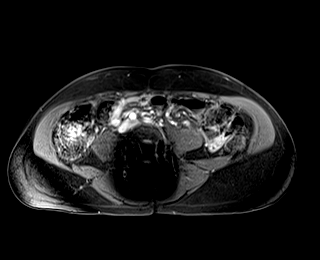
[im 40/80]
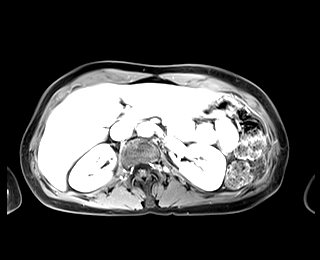
[im 80/80]
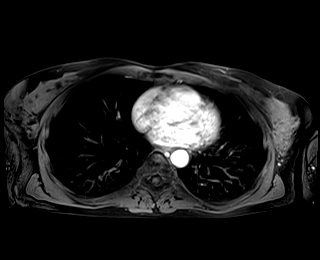

[Series 15: T1 dynamic fat-sat · axial · 3.0mm · 1.19mm/px · z∈[-126,+111]mm · 3 of 80 slices shown (3 of 5)]
[im 1/80]
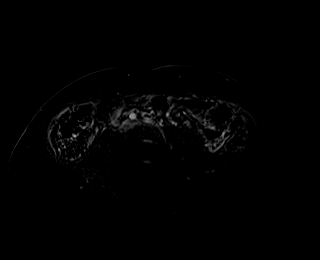
[im 40/80]
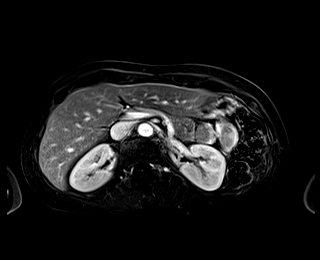
[im 80/80]
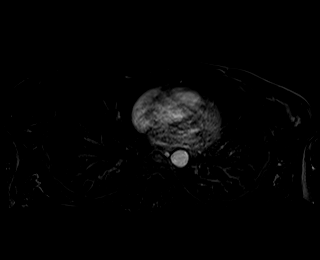

[Series 16: T1 dynamic fat-sat post-contrast · axial · 3.0mm · 1.19mm/px · z∈[-126,+111]mm · 3 of 80 slices shown (3 of 4)]
[im 1/80]
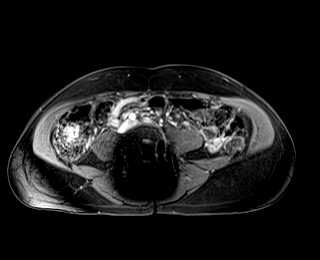
[im 40/80]
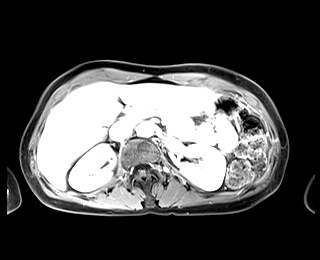
[im 80/80]
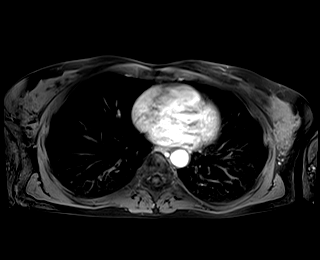

[Series 17: T1 dynamic fat-sat · axial · 3.0mm · 1.19mm/px · z∈[-126,+111]mm · 3 of 80 slices shown (4 of 5)]
[im 1/80]
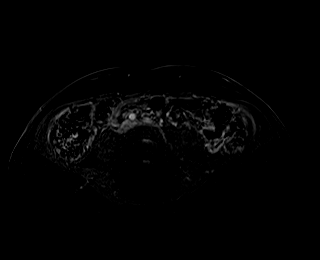
[im 40/80]
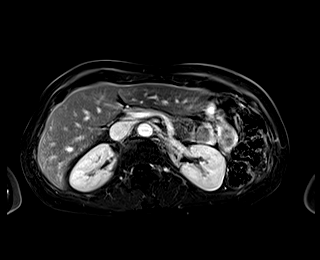
[im 80/80]
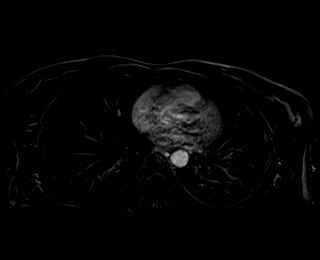

[Series 18: T1 dynamic post-contrast · coronal · 3.0mm · 1.31mm/px · 3 of 72 slices shown]
[im 1/72]
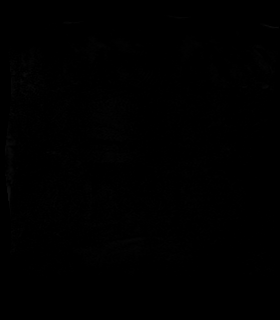
[im 36/72]
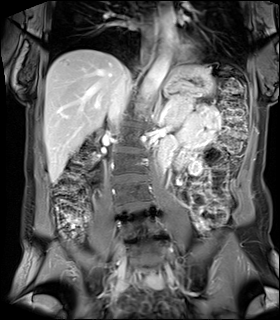
[im 72/72]
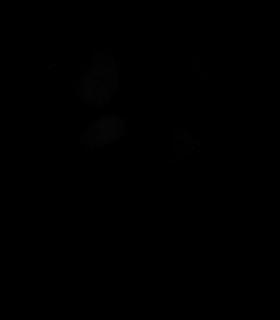

[Series 19: T1 dynamic fat-sat post-contrast · axial · 3.0mm · 1.19mm/px · z∈[-126,+111]mm · 3 of 80 slices shown (4 of 4)]
[im 1/80]
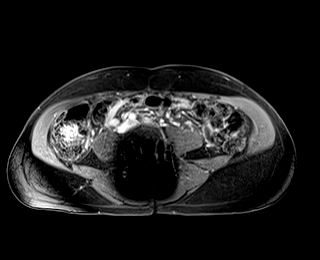
[im 40/80]
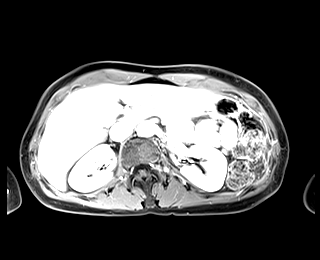
[im 80/80]
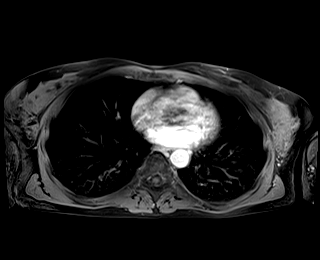

[Series 20: T1 dynamic fat-sat · axial · 3.0mm · 1.19mm/px · z∈[-126,+111]mm · 3 of 80 slices shown (5 of 5)]
[im 1/80]
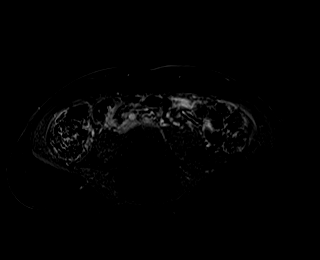
[im 40/80]
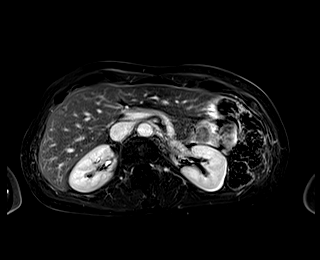
[im 80/80]
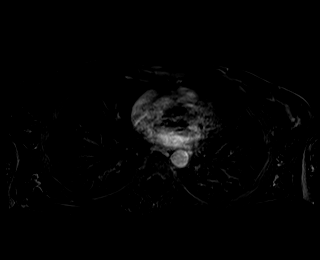

[48 of 48 positions shown; findings below may reference images not displayed]

FINDINGS: Lower chest: No acute findings.

Hepatobiliary: There is a fluid signal lesion of the lateral dome of
the liver measuring 1.4 cm, which exhibits discontinuous peripheral
nodular enhancement on multiphasic contrast enhanced sequences,
consistent with a benign hepatic hemangioma. No other mass or other
parenchymal abnormality identified. Small gallstone near the
gallbladder neck. No biliary ductal dilatation.

Pancreas: No mass, inflammatory changes, or other parenchymal
abnormality identified.

Spleen:  Within normal limits in size and appearance.

Adrenals/Urinary Tract: No masses identified. No evidence of
hydronephrosis.

Stomach/Bowel: Visualized portions within the abdomen are
unremarkable.

Vascular/Lymphatic: No pathologically enlarged lymph nodes
identified. No abdominal aortic aneurysm demonstrated.

Other:  None.

Musculoskeletal: No suspicious bone lesions identified.
IMPRESSION: 1. There is a fluid signal lesion of the lateral dome of the liver
measuring 1.4 cm, which exhibits discontinuous peripheral nodular
enhancement on multiphasic contrast enhanced sequences, consistent
with a small benign hepatic hemangioma.
2. Cholelithiasis.

## 2020-09-06 LAB — EXTERNAL GENERIC LAB PROCEDURE: COLOGUARD: NEGATIVE

## 2020-09-06 LAB — COLOGUARD: COLOGUARD: NEGATIVE

## 2020-12-04 ENCOUNTER — Ambulatory Visit (INDEPENDENT_AMBULATORY_CARE_PROVIDER_SITE_OTHER): Payer: Self-pay | Admitting: Dermatology

## 2020-12-04 ENCOUNTER — Other Ambulatory Visit: Payer: Self-pay

## 2020-12-04 DIAGNOSIS — L300 Nummular dermatitis: Secondary | ICD-10-CM

## 2020-12-04 DIAGNOSIS — L920 Granuloma annulare: Secondary | ICD-10-CM

## 2020-12-04 DIAGNOSIS — L821 Other seborrheic keratosis: Secondary | ICD-10-CM

## 2020-12-04 MED ORDER — CLOBETASOL PROPIONATE 0.05 % EX SOLN: CUTANEOUS | 1 refills | Status: AC

## 2020-12-04 NOTE — Patient Instructions (Addendum)
Eczema Skin Care  Buy TWO 16oz jars of CeraVe moisturizing cream  [facility], [facility], [facility] (no prescription needed)  Costs about $15 per jar   Jar #1: Use as a moisturizer as needed. Can be applied to any area of the body. Use twice daily to unaffected areas.  Jar #2: Pour one 49ml bottle of clobetasol 0.05% solution into jar, mix well. Label this jar to indicate the medication has been added. Use twice daily to affected areas. Do not apply to face, groin or underarms.  Moisturizer may burn or sting initially. Try for at least 4 weeks.   Dry Skin Care  What causes dry skin?  Dry skin is common and results from inadequate moisture in the outer skin layers. Dry skin usually results from the excessive loss of moisture from the skin surface. This occurs due to two major factors: 1. Normally the skin's oil glands deposit a layer of oil on the skin's surface. This layer of oil prevents the loss of moisture from the skin. Exposure to soaps, cleaners, solvents, and disinfectants removes this oily film, allowing water to escape. 2. Water loss from the skin increases when the humidity is low. During winter months we spend a lot of time indoors where the air is heated. Heated air has very low humidity. This also contributes to dry skin.  A tendency for dry skin may accompany such disorders as eczema. Also, as people age, the number of functioning oil glands decreases, and the tendency toward dry skin can be a sensation of skin tightness when emerging from the shower.  How do I manage dry skin?  1. Humidify your environment. This can be accomplished by using a humidifier in your bedroom at night during winter months. 2. Bathing can actually put moisture back into your skin if done right. Take the following steps while bathing to sooth dry skin:  Avoid hot water, which only dries the skin and makes itching worse. Use warm water.  Avoid washcloths or extensive rubbing or scrubbing.  Use mild soaps  like unscented Dove, Oil of Olay, Cetaphil, Basis, or CeraVe.  If you take baths rather than showers, rinse off soap residue with clean water before getting out of tub.  Once out of the shower/tub, pat dry gently with a soft towel. Leave your skin damp.  While still damp, apply any medicated ointment/cream you were prescribed to the affected areas. After you apply your medicated ointment/cream, then apply your moisturizer to your whole body.This is the most important step in dry skin care. If this is omitted, your skin will continue to be dry.  The choice of moisturizer is also very important. In general, lotion will not provider enough moisture to severely dry skin because it is water based. You should use an ointment or cream. Moisturizers should also be unscented. Good choices include Vaseline (plain petrolatum), Aquaphor, Cetaphil, CeraVe, Vanicream, DML Forte, Aveeno moisture, or Eucerin Cream.  Bath oils can be helpful, but do not replace the application of moisturizer after the bath. In addition, they make the tub slippery causing an increased risk for falls. Therefore, we do not recommend their use.   Seborrheic Keratosis  What causes seborrheic keratoses? Seborrheic keratoses are harmless, common skin growths that first appear during adult life.  As time goes by, more growths appear.  Some people may develop a large number of them.  Seborrheic keratoses appear on both covered and uncovered body parts.  They are not caused by sunlight.  The tendency to  develop seborrheic keratoses can be inherited.  They vary in color from skin-colored to gray, brown, or even black.  They can be either smooth or have a rough, warty surface.   Seborrheic keratoses are superficial and look as if they were stuck on the skin.  Under the microscope this type of keratosis looks like layers upon layers of skin.  That is why at times the top layer may seem to fall off, but the rest of the growth remains and  re-grows.    Treatment Seborrheic keratoses do not need to be treated, but can easily be removed in the office.  Seborrheic keratoses often cause symptoms when they rub on clothing or jewelry.  Lesions can be in the way of shaving.  If they become inflamed, they can cause itching, soreness, or burning.  Removal of a seborrheic keratosis can be accomplished by freezing, burning, or surgery. If any spot bleeds, scabs, or grows rapidly, please return to have it checked, as these can be an indication of a skin cancer.

## 2020-12-04 NOTE — Progress Notes (Signed)
   Follow-Up Visit   Subjective  Annette Davis is a 60 y.o. female who presents for the following: Rash (Rash on abdomen, back, upper arms, popliteal, started August 2021. Cleared for one month, but came back in January. Rash is itchy and stings. No new products since rash came up. She did switch to Sensitive Skin Laundry Detergent and Oil of Olay Exfoliating Moisturizer to see if rash would improve. No history of eczema, allergies, or asthma. No one else around patient is itchy.) and Growth (She has a spot on her left arm that came up 2 weeks ago and is growing. The growth started red and has turned brown now.).   The following portions of the chart were reviewed this encounter and updated as appropriate:       Review of Systems:  No other skin or systemic complaints except as noted in HPI or Assessment and Plan.  Objective  Well appearing patient in no apparent distress; mood and affect are within normal limits.  A focused examination was performed including face, trunk, extremities. Relevant physical exam findings are noted in the Assessment and Plan.  Objective  Back: Pink scaly patches upper back; scattered excoriated papules on back.  Objective  R ant thigh, abd, knees: Annular violaceous patches   Assessment & Plan   Seborrheic Keratoses - Stuck-on, waxy, tan-brown papules and plaques, including left forearm  - Discussed benign etiology and prognosis. - Observe - Call for any changes  Nummular dermatitis Back  Start clobetasol solution mixed in CeraVe Cream and apply all over BID until rash improved dsp 35mL 1Rf. Avoid face, groin, axilla.  D/c exfoliating moisturizer- recommend plain CeraVe cream.  Recommend mild soap (Dove) and moisturizing cream 1-2 times daily.  Dry skin care handout given  Start oral Benadryl qhs prn itch.  Topical steroids (such as triamcinolone, fluocinolone, fluocinonide, mometasone, clobetasol, halobetasol, betamethasone, hydrocortisone)  can cause thinning and lightening of the skin if they are used for too long in the same area. Your physician has selected the right strength medicine for your problem and area affected on the body. Please use your medication only as directed by your physician to prevent side effects.     clobetasol (TEMOVATE) 0.05 % external solution - Back  Granuloma annulare R ant thigh, abd, knees  Biopsy proven. Benign chronic condition  Stable. Not bothersome to pt  May start clobetasol/CeraVe mix qd/bid if flares.  Return in about 1 month (around 01/04/2021) for Nummular derm.   ICherlyn Labella, CMA, am acting as scribe for Willeen Niece, MD .  Documentation: I have reviewed the above documentation for accuracy and completeness, and I agree with the above.  Willeen Niece MD

## 2021-01-07 ENCOUNTER — Ambulatory Visit: Payer: Self-pay | Admitting: Dermatology

## 2021-04-22 ENCOUNTER — Other Ambulatory Visit: Payer: Self-pay | Admitting: Internal Medicine

## 2021-04-22 DIAGNOSIS — Z1231 Encounter for screening mammogram for malignant neoplasm of breast: Secondary | ICD-10-CM

## 2021-05-23 ENCOUNTER — Other Ambulatory Visit: Payer: Self-pay

## 2021-05-23 ENCOUNTER — Ambulatory Visit
Admission: RE | Admit: 2021-05-23 | Discharge: 2021-05-23 | Disposition: A | Discharge status: Home / Self Care | Payer: Self-pay | Source: Ambulatory Visit | Attending: Internal Medicine | Admitting: Internal Medicine

## 2021-05-23 DIAGNOSIS — Z1231 Encounter for screening mammogram for malignant neoplasm of breast: Secondary | ICD-10-CM | POA: Insufficient documentation

## 2022-05-28 ENCOUNTER — Other Ambulatory Visit: Payer: Self-pay | Admitting: Internal Medicine

## 2022-05-28 DIAGNOSIS — Z1231 Encounter for screening mammogram for malignant neoplasm of breast: Secondary | ICD-10-CM

## 2022-05-29 ENCOUNTER — Other Ambulatory Visit: Payer: Self-pay | Admitting: Internal Medicine

## 2022-05-29 DIAGNOSIS — Z1231 Encounter for screening mammogram for malignant neoplasm of breast: Secondary | ICD-10-CM

## 2022-06-18 ENCOUNTER — Ambulatory Visit
Admission: RE | Admit: 2022-06-18 | Discharge: 2022-06-18 | Disposition: A | Discharge status: Home / Self Care | Payer: Self-pay | Source: Ambulatory Visit | Attending: Internal Medicine | Admitting: Internal Medicine

## 2022-06-18 DIAGNOSIS — Z1231 Encounter for screening mammogram for malignant neoplasm of breast: Secondary | ICD-10-CM | POA: Insufficient documentation

## 2023-08-10 ENCOUNTER — Encounter: Payer: Self-pay | Admitting: Internal Medicine

## 2023-08-10 ENCOUNTER — Telehealth: Payer: Self-pay | Admitting: *Deleted

## 2023-08-10 DIAGNOSIS — Z1231 Encounter for screening mammogram for malignant neoplasm of breast: Secondary | ICD-10-CM

## 2023-08-20 ENCOUNTER — Other Ambulatory Visit: Payer: Self-pay | Admitting: Internal Medicine

## 2023-08-20 DIAGNOSIS — Z1231 Encounter for screening mammogram for malignant neoplasm of breast: Secondary | ICD-10-CM

## 2023-09-09 ENCOUNTER — Ambulatory Visit
Admission: RE | Admit: 2023-09-09 | Discharge: 2023-09-09 | Disposition: A | Discharge status: Home / Self Care | Payer: Self-pay | Source: Ambulatory Visit | Attending: Internal Medicine | Admitting: Internal Medicine

## 2023-09-09 DIAGNOSIS — Z1231 Encounter for screening mammogram for malignant neoplasm of breast: Secondary | ICD-10-CM | POA: Insufficient documentation
